# Patient Record
Sex: Female | Born: 1965 | Race: White | Hispanic: No | Smoking: Current every day smoker
Health system: Southern US, Community
[De-identification: ages and names within clinical notes are randomized; demographics above are authoritative.]

## PROBLEM LIST (undated history)

## (undated) DIAGNOSIS — I10 Essential (primary) hypertension: Secondary | ICD-10-CM

## (undated) DIAGNOSIS — F419 Anxiety disorder, unspecified: Secondary | ICD-10-CM

## (undated) HISTORY — PX: OTHER SURGICAL HISTORY: SHX169

---

## 2007-04-28 ENCOUNTER — Other Ambulatory Visit: Admission: RE | Admit: 2007-04-28 | Discharge: 2007-04-28 | Payer: Self-pay | Admitting: Family Medicine

## 2009-08-25 ENCOUNTER — Other Ambulatory Visit: Admission: RE | Admit: 2009-08-25 | Discharge: 2009-08-25 | Payer: Self-pay | Admitting: Family Medicine

## 2011-08-05 ENCOUNTER — Emergency Department (HOSPITAL_COMMUNITY)
Admission: EM | Admit: 2011-08-05 | Discharge: 2011-08-05 | Disposition: A | Discharge status: Home / Self Care | Payer: 59 | Attending: Emergency Medicine | Admitting: Emergency Medicine

## 2011-08-05 ENCOUNTER — Other Ambulatory Visit: Payer: Self-pay

## 2011-08-05 ENCOUNTER — Encounter (HOSPITAL_COMMUNITY): Payer: Self-pay | Admitting: *Deleted

## 2011-08-05 DIAGNOSIS — F411 Generalized anxiety disorder: Secondary | ICD-10-CM | POA: Insufficient documentation

## 2011-08-05 DIAGNOSIS — R070 Pain in throat: Secondary | ICD-10-CM | POA: Insufficient documentation

## 2011-08-05 DIAGNOSIS — R000 Tachycardia, unspecified: Secondary | ICD-10-CM | POA: Insufficient documentation

## 2011-08-05 DIAGNOSIS — J3489 Other specified disorders of nose and nasal sinuses: Secondary | ICD-10-CM | POA: Insufficient documentation

## 2011-08-05 DIAGNOSIS — F17200 Nicotine dependence, unspecified, uncomplicated: Secondary | ICD-10-CM | POA: Insufficient documentation

## 2011-08-05 DIAGNOSIS — R079 Chest pain, unspecified: Secondary | ICD-10-CM | POA: Insufficient documentation

## 2011-08-05 DIAGNOSIS — Z79899 Other long term (current) drug therapy: Secondary | ICD-10-CM | POA: Insufficient documentation

## 2011-08-05 DIAGNOSIS — I10 Essential (primary) hypertension: Secondary | ICD-10-CM | POA: Insufficient documentation

## 2011-08-05 HISTORY — DX: Essential (primary) hypertension: I10

## 2011-08-05 HISTORY — DX: Anxiety disorder, unspecified: F41.9

## 2011-08-05 LAB — CARDIAC PANEL(CRET KIN+CKTOT+MB+TROPI): CK, MB: 1.8 ng/mL (ref 0.3–4.0)

## 2011-08-05 LAB — DIFFERENTIAL
Basophils Relative: 0 % (ref 0–1)
Eosinophils Absolute: 0.1 10*3/uL (ref 0.0–0.7)
Lymphs Abs: 1.4 10*3/uL (ref 0.7–4.0)
Monocytes Absolute: 0.5 10*3/uL (ref 0.1–1.0)
Monocytes Relative: 7 % (ref 3–12)
Neutrophils Relative %: 69 % (ref 43–77)

## 2011-08-05 LAB — POCT I-STAT, CHEM 8
Chloride: 101 mEq/L (ref 96–112)
Creatinine, Ser: 0.7 mg/dL (ref 0.50–1.10)
Glucose, Bld: 122 mg/dL — ABNORMAL HIGH (ref 70–99)
HCT: 40 % (ref 36.0–46.0)
Hemoglobin: 13.6 g/dL (ref 12.0–15.0)
Potassium: 3.7 mEq/L (ref 3.5–5.1)
Sodium: 136 mEq/L (ref 135–145)

## 2011-08-05 LAB — CBC
Hemoglobin: 12.9 g/dL (ref 12.0–15.0)
MCH: 36.9 pg — ABNORMAL HIGH (ref 26.0–34.0)
Platelets: 200 10*3/uL (ref 150–400)
RBC: 3.5 MIL/uL — ABNORMAL LOW (ref 3.87–5.11)
WBC: 6.3 10*3/uL (ref 4.0–10.5)

## 2011-08-05 MED ORDER — SODIUM CHLORIDE 0.9 % IV BOLUS (SEPSIS)
500.0000 mL | Freq: Once | INTRAVENOUS | Status: AC
  Administered 2011-08-05: 500 mL via INTRAVENOUS

## 2011-08-05 NOTE — ED Provider Notes (Signed)
History     CSN: [id number]  Arrival date & time 08/05/11  0151   First MD Initiated Contact with Patient 08/05/11 0301      Chief Complaint  Patient presents with  . Palpitations    (Consider location/radiation/quality/duration/timing/severity/associated sxs/prior treatment) HPI Comments: Patient didn't remember if she took them up.  Her metoprolol yesterday with breakfast.  This evening while relaxing prior to bed.  She asked and she became very concerned.  Denies shortness of breath, nausea, diaphoresis, chest pain, although she says it uncomfortable  Patient is a 46 y.o. female presenting with palpitations. The history is provided by the patient.  Palpitations  This is a new problem. The current episode started 1 to 2 hours ago. The problem occurs constantly. The problem has not changed since onset.On average, each episode lasts 3 hours. Associated symptoms include chest pressure. Pertinent negatives include no fever, no numbness, no chest pain, no irregular heartbeat, no near-syncope, no syncope, no nausea, no headaches, no dizziness, no weakness and no shortness of breath. She has tried nothing for the symptoms.    Past Medical History  Diagnosis Date  . Anxiety   . Hypertension     History reviewed. No pertinent past surgical history.  No family history on file.  History  Substance Use Topics  . Smoking status: Current Everyday Smoker -- 0.5 packs/day for 5 years    Types: Cigarettes  . Smokeless tobacco: Not on file  . Alcohol Use: 0.6 oz/week    1 Glasses of wine per week    OB History    Grav Para Term Preterm Abortions TAB SAB Ect Mult Living                  Review of Systems  Constitutional: Negative for fever.  HENT: Positive for congestion and sore throat.   Respiratory: Negative for shortness of breath.   Cardiovascular: Positive for palpitations. Negative for chest pain, syncope and near-syncope.  Gastrointestinal: Negative for nausea.    Genitourinary: Negative for dysuria.  Neurological: Negative for dizziness, weakness, numbness and headaches.    Allergies  Benadryl and Losartan  Home Medications   Current Outpatient Rx  Name Route Sig Dispense Refill  . ALPRAZOLAM 0.5 MG PO TABS Oral Take 0.5 mg by mouth at bedtime as needed.    Marland Kitchen METOPROLOL SUCCINATE ER 50 MG PO TB24 Oral Take 50 mg by mouth daily. Take with or immediately following a meal.      BP 121/76  Pulse 101  Temp(Src) 98.7 F (37.1 C) (Oral)  Resp 19  Ht 5\' 3"  (1.6 m)  Wt 114 lb (51.71 kg)  BMI 20.19 kg/m2  SpO2 99%  LMP 08/05/2011  Physical Exam  Constitutional: She is oriented to person, place, and time. She appears well-developed.  HENT:  Head: Normocephalic.  Eyes: Pupils are equal, round, and reactive to light.  Neck: Normal range of motion.  Cardiovascular: Regular rhythm, S1 normal and S2 normal.   No extrasystoles are present. Tachycardia present.  PMI is not displaced.   No murmur heard. Pulmonary/Chest: Breath sounds normal. No respiratory distress.  Abdominal: Soft. She exhibits no distension.  Musculoskeletal: She exhibits no edema and no tenderness.  Neurological: She is alert and oriented to person, place, and time.  Skin: Skin is warm and dry.    ED Course  Procedures (including critical care time)  Labs Reviewed  CBC - Abnormal; Notable for the following:    RBC 3.50 (*)  MCV 105.7 (*)    MCH 36.9 (*)    All other components within normal limits  POCT I-STAT, CHEM 8 - Abnormal; Notable for the following:    BUN 5 (*)    Glucose, Bld 122 (*)    All other components within normal limits  DIFFERENTIAL  CARDIAC PANEL(CRET KIN+CKTOT+MB+TROPI)   No results found.   1. Tachycardia     ED ECG REPORT   Date: 08/05/2011  EKG Time: 5:20 AM  Rate: 117  Rhythm: sinus tachycardia,  there are no previous tracings available for comparison  Axis: normal  Intervals:second-degree A-V block, ( Mobitz II )  ST&T  Change: borderline T wave changes  Narrative Interpretation: abnormal      Discussed lab results EKG results with patient did administer a.m. dose of metoprolol.  We'll observe for an additional 30 minutes and then discharged home with followup with her primary care physician.  She is in agreement with this plan   0518 in the morning.  Rate is 92, will discharge him  MDM  Will assess cardiac markers, electrolytes, CBC monitor patient, administer IV fluids.  Initial EKG showed sinus tachycardia with heart rate is within the normal rate.  We'll repeat EKG        Arman Filter, NP 08/05/11 0319  Arman Filter, NP 08/05/11 0452  Arman Filter, NP 08/05/11 [phone]   Medical screening examination/treatment/procedure(s) were performed by non-physician practitioner and as supervising physician I was immediately available for consultation/collaboration.  Sunnie Nielsen, MD 08/05/11 [phone]

## 2011-08-05 NOTE — ED Notes (Signed)
Pt was feeling anxious and began to have palpitations and shortness of breath.  Pt took xanax in an unsuccessful attempt to quell her anxiety.  Pt denies sweating, N/V, dizziness, or weakness.

## 2011-08-05 NOTE — ED Notes (Signed)
[id number]> Expected date:<BR> Expected time:<BR> Means of arrival:<BR> Comments:<BR> Terminal clean...MITES!

## 2011-08-05 NOTE — ED Notes (Signed)
Pt states while reading about 2200 last night, pt states felt as if heart was "pounding" pt states she took one of her Alprazolam, felt worse took another at 2300. Pt states she became SOB, diaphoretic. Pt denies pain. Pt unsure if she took her Metoprolol today. Pt A & O, states she feels as if symptoms getting better. IV est, labs drawn. EKG completed in triage, family at bedside.

## 2011-08-05 NOTE — Discharge Instructions (Signed)
Tachycardia, Nonspecific In adults, the heart normally beats between 60 and 100 times a minute. A heart rate over 100 is called tachycardia. When your heart beats too fast, it may not be able to pump enough blood to the rest of the body. CAUSES   Exercise or exertion.   Fever.   Pain or injury.   Infection.   Loss of fluid (dehydration).   Overactive thyroid.   Lack of red blood cells (anemia).   Anxiety.   Alcohol.   Heart arrhythmia.   Caffeine.   Tobacco products.   Diet pills.   Street drugs.   Heart disease.  SYMPTOMS  Palpitations (rapid or irregular heartbeat).   Dizziness.   Tiredness (fatigue).   Shortness of breath.  DIAGNOSIS  After an exam and taking a history, your caregiver may order:  Blood tests.   Electrocardiogram (EKG).   Heart monitor.  TREATMENT  Treatment will depend on the cause and potential for harm. It may include:  Intravenous (IV) replacement of fluids or blood.   Antidote or reversal medicines.   Changes in your present medicines.   Lifestyle changes.  HOME CARE INSTRUCTIONS   Get rest.   Drink enough water and fluids to keep your urine clear or pale yellow.   Avoid:   Caffeine.   Nicotine.   Alcohol.   Stress.   Chocolate.   Stimulants.   Only take medicine as directed by your caregiver.  SEEK IMMEDIATE MEDICAL CARE IF:   You have pain in your chest, upper arms, jaw, or neck.   You become weak, dizzy, or feel faint.   You have palpitations that will not go away.   You throw up (vomit), have diarrhea, or pass blood.   You look pale and your skin is cool and wet.  MAKE SURE YOU:   Understand these instructions.   Will watch your condition.   Will get help right away if you are not doing well or get worse.  Document Released: 07/11/2004 Document Revised: 02/13/2011 Document Reviewed: 06/03/2005 [facility] Patient Information 2012 [place], [place]. As discussed.  Her cardiac markers were  normal.  Her electrolytes are normal.  Your EKG, other than being at a rate of 117 is normal.  Please make sure to take your medication on a daily basis

## 2011-08-22 ENCOUNTER — Other Ambulatory Visit: Payer: Self-pay | Admitting: Family Medicine

## 2011-08-22 ENCOUNTER — Ambulatory Visit
Admission: RE | Admit: 2011-08-22 | Discharge: 2011-08-22 | Disposition: A | Discharge status: Home / Self Care | Payer: 59 | Source: Ambulatory Visit | Attending: Family Medicine | Admitting: Family Medicine

## 2011-08-22 DIAGNOSIS — M545 Low back pain, unspecified: Secondary | ICD-10-CM

## 2011-10-11 ENCOUNTER — Other Ambulatory Visit (HOSPITAL_COMMUNITY)
Admission: RE | Admit: 2011-10-11 | Discharge: 2011-10-11 | Disposition: A | Discharge status: Home / Self Care | Payer: 59 | Source: Ambulatory Visit | Attending: Family Medicine | Admitting: Family Medicine

## 2011-10-11 ENCOUNTER — Other Ambulatory Visit: Payer: Self-pay | Admitting: Family Medicine

## 2011-10-11 DIAGNOSIS — Z124 Encounter for screening for malignant neoplasm of cervix: Secondary | ICD-10-CM | POA: Insufficient documentation

## 2012-07-03 ENCOUNTER — Other Ambulatory Visit (HOSPITAL_COMMUNITY): Payer: Self-pay | Admitting: Family Medicine

## 2012-07-03 DIAGNOSIS — Z1231 Encounter for screening mammogram for malignant neoplasm of breast: Secondary | ICD-10-CM

## 2012-07-14 ENCOUNTER — Ambulatory Visit (HOSPITAL_COMMUNITY)
Admission: RE | Admit: 2012-07-14 | Discharge: 2012-07-14 | Disposition: A | Discharge status: Home / Self Care | Payer: 59 | Source: Ambulatory Visit | Attending: Family Medicine | Admitting: Family Medicine

## 2012-07-14 ENCOUNTER — Other Ambulatory Visit: Payer: Self-pay | Admitting: Family Medicine

## 2012-07-14 DIAGNOSIS — Z1231 Encounter for screening mammogram for malignant neoplasm of breast: Secondary | ICD-10-CM

## 2012-07-14 DIAGNOSIS — N63 Unspecified lump in unspecified breast: Secondary | ICD-10-CM

## 2012-07-20 ENCOUNTER — Ambulatory Visit
Admission: RE | Admit: 2012-07-20 | Discharge: 2012-07-20 | Disposition: A | Discharge status: Home / Self Care | Payer: 59 | Source: Ambulatory Visit | Attending: Family Medicine | Admitting: Family Medicine

## 2012-07-20 DIAGNOSIS — N63 Unspecified lump in unspecified breast: Secondary | ICD-10-CM

## 2015-09-19 ENCOUNTER — Other Ambulatory Visit: Payer: Self-pay | Admitting: Family Medicine

## 2015-09-19 ENCOUNTER — Other Ambulatory Visit (HOSPITAL_COMMUNITY)
Admission: RE | Admit: 2015-09-19 | Discharge: 2015-09-19 | Disposition: A | Discharge status: Home / Self Care | Payer: BLUE CROSS/BLUE SHIELD | Source: Ambulatory Visit | Attending: Family Medicine | Admitting: Family Medicine

## 2015-09-19 DIAGNOSIS — Z1211 Encounter for screening for malignant neoplasm of colon: Secondary | ICD-10-CM | POA: Diagnosis not present

## 2015-09-19 DIAGNOSIS — Z1151 Encounter for screening for human papillomavirus (HPV): Secondary | ICD-10-CM | POA: Diagnosis not present

## 2015-09-19 DIAGNOSIS — E785 Hyperlipidemia, unspecified: Secondary | ICD-10-CM | POA: Diagnosis not present

## 2015-09-19 DIAGNOSIS — Z01419 Encounter for gynecological examination (general) (routine) without abnormal findings: Secondary | ICD-10-CM | POA: Diagnosis not present

## 2015-09-19 DIAGNOSIS — Z0000 Encounter for general adult medical examination without abnormal findings: Secondary | ICD-10-CM | POA: Diagnosis not present

## 2015-09-19 DIAGNOSIS — Z124 Encounter for screening for malignant neoplasm of cervix: Secondary | ICD-10-CM | POA: Diagnosis not present

## 2015-09-22 LAB — CYTOLOGY - PAP

## 2016-03-22 DIAGNOSIS — F410 Panic disorder [episodic paroxysmal anxiety] without agoraphobia: Secondary | ICD-10-CM | POA: Diagnosis not present

## 2016-04-02 DIAGNOSIS — H04123 Dry eye syndrome of bilateral lacrimal glands: Secondary | ICD-10-CM | POA: Diagnosis not present

## 2016-04-02 DIAGNOSIS — H01001 Unspecified blepharitis right upper eyelid: Secondary | ICD-10-CM | POA: Diagnosis not present

## 2016-04-02 DIAGNOSIS — H01002 Unspecified blepharitis right lower eyelid: Secondary | ICD-10-CM | POA: Diagnosis not present

## 2016-04-02 DIAGNOSIS — H524 Presbyopia: Secondary | ICD-10-CM | POA: Diagnosis not present

## 2016-09-23 DIAGNOSIS — E785 Hyperlipidemia, unspecified: Secondary | ICD-10-CM | POA: Diagnosis not present

## 2016-09-23 DIAGNOSIS — I10 Essential (primary) hypertension: Secondary | ICD-10-CM | POA: Diagnosis not present

## 2016-09-23 DIAGNOSIS — Z0000 Encounter for general adult medical examination without abnormal findings: Secondary | ICD-10-CM | POA: Diagnosis not present

## 2016-09-23 DIAGNOSIS — Z1211 Encounter for screening for malignant neoplasm of colon: Secondary | ICD-10-CM | POA: Diagnosis not present

## 2017-07-25 DIAGNOSIS — M7741 Metatarsalgia, right foot: Secondary | ICD-10-CM | POA: Diagnosis not present

## 2017-07-25 DIAGNOSIS — M79671 Pain in right foot: Secondary | ICD-10-CM | POA: Diagnosis not present

## 2017-08-21 DIAGNOSIS — M7741 Metatarsalgia, right foot: Secondary | ICD-10-CM | POA: Diagnosis not present

## 2017-08-21 DIAGNOSIS — M79671 Pain in right foot: Secondary | ICD-10-CM | POA: Diagnosis not present

## 2017-09-30 DIAGNOSIS — E785 Hyperlipidemia, unspecified: Secondary | ICD-10-CM | POA: Diagnosis not present

## 2017-09-30 DIAGNOSIS — I10 Essential (primary) hypertension: Secondary | ICD-10-CM | POA: Diagnosis not present

## 2017-09-30 DIAGNOSIS — Z0000 Encounter for general adult medical examination without abnormal findings: Secondary | ICD-10-CM | POA: Diagnosis not present

## 2017-11-24 DIAGNOSIS — F419 Anxiety disorder, unspecified: Secondary | ICD-10-CM | POA: Diagnosis not present

## 2017-11-24 DIAGNOSIS — F17210 Nicotine dependence, cigarettes, uncomplicated: Secondary | ICD-10-CM | POA: Diagnosis not present

## 2017-11-24 DIAGNOSIS — R251 Tremor, unspecified: Secondary | ICD-10-CM | POA: Diagnosis not present

## 2017-12-26 DIAGNOSIS — R05 Cough: Secondary | ICD-10-CM | POA: Diagnosis not present

## 2018-09-24 DIAGNOSIS — M6289 Other specified disorders of muscle: Secondary | ICD-10-CM | POA: Diagnosis not present

## 2018-09-28 DIAGNOSIS — H1013 Acute atopic conjunctivitis, bilateral: Secondary | ICD-10-CM | POA: Diagnosis not present

## 2018-09-28 DIAGNOSIS — M25539 Pain in unspecified wrist: Secondary | ICD-10-CM | POA: Diagnosis not present

## 2018-09-28 DIAGNOSIS — I10 Essential (primary) hypertension: Secondary | ICD-10-CM | POA: Diagnosis not present

## 2019-10-21 DIAGNOSIS — E785 Hyperlipidemia, unspecified: Secondary | ICD-10-CM | POA: Diagnosis not present

## 2019-10-21 DIAGNOSIS — F410 Panic disorder [episodic paroxysmal anxiety] without agoraphobia: Secondary | ICD-10-CM | POA: Diagnosis not present

## 2019-10-21 DIAGNOSIS — J309 Allergic rhinitis, unspecified: Secondary | ICD-10-CM | POA: Diagnosis not present

## 2019-10-21 DIAGNOSIS — I10 Essential (primary) hypertension: Secondary | ICD-10-CM | POA: Diagnosis not present

## 2019-11-27 DIAGNOSIS — Z20822 Contact with and (suspected) exposure to covid-19: Secondary | ICD-10-CM | POA: Diagnosis not present

## 2019-11-27 DIAGNOSIS — Z03818 Encounter for observation for suspected exposure to other biological agents ruled out: Secondary | ICD-10-CM | POA: Diagnosis not present

## 2019-11-30 ENCOUNTER — Other Ambulatory Visit: Payer: Self-pay

## 2019-11-30 ENCOUNTER — Encounter (HOSPITAL_COMMUNITY): Payer: Self-pay

## 2019-11-30 ENCOUNTER — Ambulatory Visit (HOSPITAL_COMMUNITY)
Admission: EM | Admit: 2019-11-30 | Discharge: 2019-11-30 | Disposition: A | Discharge status: Home / Self Care | Payer: BC Managed Care – PPO | Attending: Family Medicine | Admitting: Family Medicine

## 2019-11-30 DIAGNOSIS — R300 Dysuria: Secondary | ICD-10-CM | POA: Insufficient documentation

## 2019-11-30 DIAGNOSIS — R350 Frequency of micturition: Secondary | ICD-10-CM | POA: Diagnosis not present

## 2019-11-30 DIAGNOSIS — N3090 Cystitis, unspecified without hematuria: Secondary | ICD-10-CM | POA: Insufficient documentation

## 2019-11-30 DIAGNOSIS — L509 Urticaria, unspecified: Secondary | ICD-10-CM | POA: Diagnosis not present

## 2019-11-30 LAB — POCT URINALYSIS DIP (DEVICE)
Glucose, UA: 250 mg/dL — AB
Hgb urine dipstick: NEGATIVE
Ketones, ur: 15 mg/dL — AB
Nitrite: POSITIVE — AB
Protein, ur: >=300 mg/dL — AB
Specific Gravity, Urine: <=1.005 (ref 1.005–1.030)
Urobilinogen, UA: >=8 mg/dL (ref 0.0–1.0)
pH: 5 (ref 5.0–8.0)

## 2019-11-30 MED ORDER — CEPHALEXIN 500 MG PO CAPS: 500.0000 mg | ORAL_CAPSULE | Freq: Two times a day (BID) | ORAL | 0 refills | Status: DC

## 2019-11-30 MED ORDER — PREDNISONE 10 MG (21) PO TBPK: ORAL_TABLET | Freq: Every day | ORAL | 0 refills | Status: DC

## 2019-11-30 NOTE — ED Triage Notes (Signed)
Pt presents today for hives that began last night. Pt states she was given a prescription of "ciprofloxacin" for as needed "urine infections" and has been taking them for 3 days until rash and itching began. Pt has visible red rash on chest and face. Pt states rash itches and is causing loss of sleep. Pt has been treating hives with benadryl, last dose approx 0900. Pt denies difficulty breathing.

## 2019-11-30 NOTE — ED Provider Notes (Signed)
Emma Warren   [id number] 11/30/19 Arrival Time: 5361  ASSESSMENT & PLAN:  1. Urticaria   2. Urinary frequency   3. Dysuria   4. Cystitis     Begin: Meds ordered this encounter  Medications  . predniSONE (STERAPRED UNI-PAK 21 TAB) 10 MG (21) TBPK tablet    Sig: Take by mouth daily. Take as directed.    Dispense:  21 tablet    Refill:  0  . cephALEXin (KEFLEX) 500 MG capsule    Sig: Take 1 capsule (500 mg total) by mouth 2 (two) times daily.    Dispense:  10 capsule    Refill:  0    No anaphylaxis. Benadryl if needed. No s/s of pyelonephritis. Urine culture sent. Will notify of any significant results. Ensure adequate fluid intake.   Reviewed expectations re: course of current medical issues. Questions answered. Outlined signs and symptoms indicating need for more acute intervention. Understanding verbalized. After Visit Summary given.   SUBJECTIVE: History from: patient. Emma Warren is a 54 y.o. female who reports possible rxn to cipro taken for 3 days; given by her pcp to tx UTI. Finished. Noted facial swelling and skin rash yest evening. No swallowing or breathing difficulties. H/O hives. Still with urinary frequency and dysuria. Afebrile. No vaginal d/c. Benadryl without relief.   OBJECTIVE:  Vitals:   11/30/19 1007  BP: 128/78  Pulse: 75  Resp: 16  Temp: 98.5 F (36.9 C)  SpO2: 100%    General appearance: alert; no distress Eyes: PERRLA; EOMI; conjunctiva normal HENT: [place]; AT; nasal mucosa normal; oral mucosa normal Neck: supple  Lungs: speaks full sentences without difficulty; unlabored Extremities: no edema Skin: warm and dry; smooth, slightly elevated and erythematous plaques of variable size over her face and extremities mainly Neurologic: normal gait Psychological: alert and cooperative; normal mood and affect  Labs:  Labs Reviewed  POCT URINALYSIS DIP (DEVICE) - Abnormal; Notable for the following components:      Result Value     Glucose, UA 250 (*)    Bilirubin Urine MODERATE (*)    Ketones, ur 15 (*)    Protein, ur >=300 (*)    Nitrite POSITIVE (*)    Leukocytes,Ua LARGE (*)    All other components within normal limits  URINE CULTURE      Allergies  Allergen Reactions  . Losartan     Makes her hot    Past Medical History:  Diagnosis Date  . Anxiety   . Hypertension    Social History   Socioeconomic History  . Marital status: Widowed    Spouse name: Not on file  . Number of children: Not on file  . Years of education: Not on file  . Highest education level: Not on file  Occupational History  . Not on file  Tobacco Use  . Smoking status: Current Every Day Smoker    Packs/day: 0.50    Years: 5.00    Pack years: 2.50    Types: Cigarettes  . Smokeless tobacco: Never Used  Substance and Sexual Activity  . Alcohol use: Yes    Alcohol/week: 1.0 standard drink    Types: 1 Glasses of wine per week  . Drug use: Not on file  . Sexual activity: Not on file  Other Topics Concern  . Not on file  Social History Narrative  . Not on file   Social Determinants of Health   Financial Resource Strain:   . Difficulty of Paying Living Expenses:  Food Insecurity:   . Worried About Programme researcher, broadcasting/film/video in the Last Year:   . Barista in the Last Year:   Transportation Needs:   . Freight forwarder (Medical):   Marland Kitchen Lack of Transportation (Non-Medical):   Physical Activity:   . Days of Exercise per Week:   . Minutes of Exercise per Session:   Stress:   . Feeling of Stress :   Social Connections:   . Frequency of Communication with Friends and Family:   . Frequency of Social Gatherings with Friends and Family:   . Attends Religious Services:   . Active Member of Clubs or Organizations:   . Attends Banker Meetings:   Marland Kitchen Marital Status:   Intimate Partner Violence:   . Fear of Current or Ex-Partner:   . Emotionally Abused:   Marland Kitchen Physically Abused:   . Sexually Abused:     History reviewed. No pertinent family history. History reviewed. No pertinent surgical history.   Mardella Layman, MD 11/30/19 1156

## 2019-12-01 LAB — URINE CULTURE: Culture: NO GROWTH

## 2019-12-07 DIAGNOSIS — R1084 Generalized abdominal pain: Secondary | ICD-10-CM | POA: Diagnosis not present

## 2019-12-07 DIAGNOSIS — R5382 Chronic fatigue, unspecified: Secondary | ICD-10-CM | POA: Diagnosis not present

## 2019-12-07 DIAGNOSIS — E785 Hyperlipidemia, unspecified: Secondary | ICD-10-CM | POA: Diagnosis not present

## 2019-12-07 DIAGNOSIS — N951 Menopausal and female climacteric states: Secondary | ICD-10-CM | POA: Diagnosis not present

## 2019-12-07 DIAGNOSIS — Z0000 Encounter for general adult medical examination without abnormal findings: Secondary | ICD-10-CM | POA: Diagnosis not present

## 2019-12-07 DIAGNOSIS — I10 Essential (primary) hypertension: Secondary | ICD-10-CM | POA: Diagnosis not present

## 2019-12-14 ENCOUNTER — Other Ambulatory Visit: Payer: Self-pay | Admitting: Family Medicine

## 2019-12-14 DIAGNOSIS — R7989 Other specified abnormal findings of blood chemistry: Secondary | ICD-10-CM

## 2019-12-14 DIAGNOSIS — Z1231 Encounter for screening mammogram for malignant neoplasm of breast: Secondary | ICD-10-CM

## 2019-12-15 ENCOUNTER — Ambulatory Visit
Admission: RE | Admit: 2019-12-15 | Discharge: 2019-12-15 | Disposition: A | Discharge status: Home / Self Care | Payer: BC Managed Care – PPO | Source: Ambulatory Visit | Attending: Family Medicine | Admitting: Family Medicine

## 2019-12-15 ENCOUNTER — Other Ambulatory Visit: Payer: Self-pay | Admitting: Family Medicine

## 2019-12-15 DIAGNOSIS — R1084 Generalized abdominal pain: Secondary | ICD-10-CM

## 2019-12-15 DIAGNOSIS — K7689 Other specified diseases of liver: Secondary | ICD-10-CM | POA: Diagnosis not present

## 2019-12-15 DIAGNOSIS — R7989 Other specified abnormal findings of blood chemistry: Secondary | ICD-10-CM

## 2019-12-15 DIAGNOSIS — R748 Abnormal levels of other serum enzymes: Secondary | ICD-10-CM

## 2019-12-27 ENCOUNTER — Ambulatory Visit
Admission: RE | Admit: 2019-12-27 | Discharge: 2019-12-27 | Disposition: A | Discharge status: Home / Self Care | Payer: BC Managed Care – PPO | Source: Ambulatory Visit | Attending: Family Medicine | Admitting: Family Medicine

## 2019-12-27 DIAGNOSIS — K7689 Other specified diseases of liver: Secondary | ICD-10-CM | POA: Diagnosis not present

## 2019-12-27 DIAGNOSIS — R748 Abnormal levels of other serum enzymes: Secondary | ICD-10-CM

## 2019-12-27 DIAGNOSIS — R1084 Generalized abdominal pain: Secondary | ICD-10-CM

## 2019-12-27 DIAGNOSIS — M4319 Spondylolisthesis, multiple sites in spine: Secondary | ICD-10-CM | POA: Diagnosis not present

## 2019-12-27 DIAGNOSIS — M5137 Other intervertebral disc degeneration, lumbosacral region: Secondary | ICD-10-CM | POA: Diagnosis not present

## 2019-12-27 DIAGNOSIS — R7989 Other specified abnormal findings of blood chemistry: Secondary | ICD-10-CM | POA: Diagnosis not present

## 2019-12-27 MED ORDER — IOPAMIDOL (ISOVUE-300) INJECTION 61%
100.0000 mL | Freq: Once | INTRAVENOUS | Status: AC | PRN
  Administered 2019-12-27: 100 mL via INTRAVENOUS

## 2019-12-30 DIAGNOSIS — L301 Dyshidrosis [pompholyx]: Secondary | ICD-10-CM | POA: Diagnosis not present

## 2019-12-30 DIAGNOSIS — R748 Abnormal levels of other serum enzymes: Secondary | ICD-10-CM | POA: Diagnosis not present

## 2020-02-04 ENCOUNTER — Ambulatory Visit
Admission: RE | Admit: 2020-02-04 | Discharge: 2020-02-04 | Disposition: A | Discharge status: Home / Self Care | Payer: BC Managed Care – PPO | Source: Ambulatory Visit | Attending: Family Medicine | Admitting: Family Medicine

## 2020-02-04 ENCOUNTER — Other Ambulatory Visit: Payer: Self-pay

## 2020-02-04 DIAGNOSIS — Z1231 Encounter for screening mammogram for malignant neoplasm of breast: Secondary | ICD-10-CM | POA: Diagnosis not present

## 2020-02-08 DIAGNOSIS — M25473 Effusion, unspecified ankle: Secondary | ICD-10-CM | POA: Diagnosis not present

## 2020-02-08 DIAGNOSIS — M5412 Radiculopathy, cervical region: Secondary | ICD-10-CM | POA: Diagnosis not present

## 2020-02-16 ENCOUNTER — Other Ambulatory Visit (HOSPITAL_COMMUNITY)
Admission: RE | Admit: 2020-02-16 | Discharge: 2020-02-16 | Disposition: A | Discharge status: Home / Self Care | Payer: BC Managed Care – PPO | Source: Ambulatory Visit | Attending: Family Medicine | Admitting: Family Medicine

## 2020-02-16 ENCOUNTER — Other Ambulatory Visit: Payer: Self-pay | Admitting: Family Medicine

## 2020-02-16 DIAGNOSIS — Z01411 Encounter for gynecological examination (general) (routine) with abnormal findings: Secondary | ICD-10-CM | POA: Insufficient documentation

## 2020-02-16 DIAGNOSIS — Z124 Encounter for screening for malignant neoplasm of cervix: Secondary | ICD-10-CM | POA: Diagnosis not present

## 2020-02-16 DIAGNOSIS — Z0000 Encounter for general adult medical examination without abnormal findings: Secondary | ICD-10-CM | POA: Diagnosis not present

## 2020-02-18 LAB — CYTOLOGY - PAP
Comment: NEGATIVE
Diagnosis: NEGATIVE
High risk HPV: NEGATIVE

## 2020-04-24 ENCOUNTER — Encounter: Payer: Self-pay | Admitting: Neurology

## 2020-04-24 ENCOUNTER — Ambulatory Visit (INDEPENDENT_AMBULATORY_CARE_PROVIDER_SITE_OTHER): Payer: BC Managed Care – PPO | Admitting: Neurology

## 2020-04-24 VITALS — BP 127/85 | HR 96 | Ht 63.0 in | Wt 101.0 lb

## 2020-04-24 DIAGNOSIS — M79602 Pain in left arm: Secondary | ICD-10-CM | POA: Diagnosis not present

## 2020-04-24 DIAGNOSIS — R5383 Other fatigue: Secondary | ICD-10-CM

## 2020-04-24 DIAGNOSIS — M5412 Radiculopathy, cervical region: Secondary | ICD-10-CM | POA: Diagnosis not present

## 2020-04-24 DIAGNOSIS — R200 Anesthesia of skin: Secondary | ICD-10-CM | POA: Diagnosis not present

## 2020-04-24 MED ORDER — GABAPENTIN 300 MG PO CAPS: 300.0000 mg | ORAL_CAPSULE | Freq: Three times a day (TID) | ORAL | 11 refills | Status: DC

## 2020-04-24 NOTE — Progress Notes (Signed)
[facility]  PATIENT: Emma Warren DOB: Sep 08, 1965  REFERRING DOCTOR OR PCP: Shirlean Mylar, MD SOURCE: Patient, notes from Dr. Hyman Hopes, imaging reports, CT scans reviewed.  _________________________________   HISTORICAL  CHIEF COMPLAINT:  Chief Complaint  Patient presents with  . New Patient (Initial Visit)    RM 13, alone. Paper referral from Shirlean Mylar, MD (PCP) for cervical radiculopathy. Pt reports mostly left hand numb. Sometimes radiates up to shoulder. See Chiropracter for the past 2 weeks. Has happened in her right hand as well. Typically occurs while she is walking in the park.     HISTORY OF PRESENT ILLNESS:  I saw your patient, Emma Warren, at [facility] for neurologic consultation regarding her left greater than right arm pain and numbness.  She is a 54 yo woman who has had left > right arm /hand numbness over the last 2-3 months.  No activity was definitely associated with the onset of symptoms.    However a week before symptoms started she recalls moving some furniture.   When most intense, pain and numbness goes from the shoulder to her fingers, usually the left but sometimes both hands..   Sometimes all fingers and other times just the thumb.   She notes her left grip seems weaker the past couple months as well.   Ibuprofen has not helped much.   Walking longer distance increase the symptoms at times.    She has no cervical spine imaging.   In the past she had sciatica. 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  She also reports that she is having issues with feeling more tired and wonders if it could be due to her neurologic problem..   She sometimes feels off balanced when more tired.   She does not always sleep well.   She snores mildly but does not gasp or have witnessed  pauses.       REVIEW OF SYSTEMS: Constitutional: No fevers, chills, sweats, or change in appetite.  Notes mild ankle edema. She notes fatigue Eyes: No visual changes, double vision, eye pain Ear, nose and throat: No hearing loss, ear pain, nasal congestion, sore throat Cardiovascular: No chest pain, palpitations Respiratory: No shortness of breath at rest or with exertion.   No wheezes GastrointestinaI: No nausea, vomiting, diarrhea, abdominal pain, fecal incontinence Genitourinary: No dysuria, urinary retention or frequency.  No nocturia. Musculoskeletal: as above. Integumentary: No rash, pruritus, skin lesions Neurological: as above Psychiatric: No depression at this time.  No anxiety Endocrine: No palpitations, diaphoresis, change in appetite, change in weigh or increased thirst Hematologic/Lymphatic: No anemia, purpura, petechiae. Allergic/Immunologic: No itchy/runny eyes, nasal congestion, recent allergic reactions, rashes  ALLERGIES: Allergies  Allergen Reactions  . Losartan     Makes her hot    HOME MEDICATIONS:  Current Outpatient Medications:  .  ALPRAZolam (XANAX) 0.5 MG tablet, Take 0.5 mg by mouth at bedtime as needed., Disp: , Rfl:  .  ibuprofen (ADVIL) 200 MG tablet, Take 200 mg by mouth every 6 (six) hours as needed., Disp: , Rfl:  .  metoprolol succinate (TOPROL-XL) 50 MG 24 hr tablet, Take 50 mg by mouth daily. Take with or immediately following a meal., Disp: , Rfl:   PAST MEDICAL HISTORY: Past Medical History:  Diagnosis Date  . Anxiety   . Hypertension  PAST SURGICAL HISTORY: Past Surgical History:  Procedure Laterality Date  . left hand surgery     tendon repair    FAMILY HISTORY: Family History  Problem Relation Age of Onset  . Stomach cancer Maternal Grandmother   . Stomach cancer Maternal Grandfather     SOCIAL HISTORY:  Social History   Socioeconomic History  . Marital status: Widowed    Spouse name: Not on file  . Number of  children: 1  . Years of education: phD  . Highest education level: Not on file  Occupational History  . Not on file  Tobacco Use  . Smoking status: Current Every Day Smoker    Packs/day: 0.50    Years: 5.00    Pack years: 2.50    Types: Cigarettes  . Smokeless tobacco: Never Used  . Tobacco comment: 8-15 cigs per day  Substance and Sexual Activity  . Alcohol use: Not Currently    Alcohol/week: 1.0 standard drink    Types: 1 Glasses of wine per week  . Drug use: Never  . Sexual activity: Not on file  Other Topics Concern  . Not on file  Social History Narrative   Lives alone   Caffeine use: coffee daily (about 2 cups)   Right handed    Social Determinants of Health   Financial Resource Strain:   . Difficulty of Paying Living Expenses: Not on file  Food Insecurity:   . Worried About Programme researcher, broadcasting/film/video in the Last Year: Not on file  . Ran Out of Food in the Last Year: Not on file  Transportation Needs:   . Lack of Transportation (Medical): Not on file  . Lack of Transportation (Non-Medical): Not on file  Physical Activity:   . Days of Exercise per Week: Not on file  . Minutes of Exercise per Session: Not on file  Stress:   . Feeling of Stress : Not on file  Social Connections:   . Frequency of Communication with Friends and Family: Not on file  . Frequency of Social Gatherings with Friends and Family: Not on file  . Attends Religious Services: Not on file  . Active Member of Clubs or Organizations: Not on file  . Attends Banker Meetings: Not on file  . Marital Status: Not on file  Intimate Partner Violence:   . Fear of Current or Ex-Partner: Not on file  . Emotionally Abused: Not on file  . Physically Abused: Not on file  . Sexually Abused: Not on file     PHYSICAL EXAM  Vitals:   04/24/20 1455  BP: 127/85  Pulse: 96  Weight: 101 lb (45.8 kg)  Height: 5\' 3"  (1.6 m)    Body mass index is 17.89 kg/m.   General: The patient is  well-developed and well-nourished and in no acute distress  HEENT:  Head is [place]/AT.  Sclera are anicteric.  Funduscopic exam shows normal optic discs and retinal vessels.  Neck: No carotid bruits are noted.  The neck is nontender with good range of motion.  Cardiovascular: The heart has a regular rate and rhythm with a normal S1 and S2. There were no murmurs, gallops or rubs.    Skin: Extremities are without rash or edema.  Neurologic Exam  Mental status: The patient is alert and oriented x 3 at the time of the examination. The patient has apparent normal recent and remote memory, with an apparently normal attention span and concentration ability.   Speech is normal.  Cranial nerves: Extraocular movements are full. Facial strength is normal. Trapezius and sternocleidomastoid strength is normal. No obvious hearing deficits are noted.  Motor:  Muscle bulk is normal.   Tone is normal. Strength is 4+/5 in the triceps, finger extensors and pronators on the left and 5 / 5 elsewhere.   Sensory: Sensory testing is intact to pinprick, soft touch and vibration sensation in all 4 extremities.  Coordination: Cerebellar testing reveals good finger-nose-finger and heel-to-shin bilaterally.  Gait and station: Station is normal.   Gait is normal. Tandem gait is normal. Romberg is negative.   Reflexes: Deep tendon reflexes are symmetric and normal bilaterally.       DIAGNOSTIC DATA (LABS, IMAGING, TESTING) - I reviewed patient records, labs, notes, testing and imaging myself where available.  Lab Results  Component Value Date   WBC 6.3 08/05/2011   HGB 13.6 08/05/2011   HCT 40.0 08/05/2011   MCV 105.7 (H) 08/05/2011   PLT 200 08/05/2011      Component Value Date/Time   NA 136 08/05/2011 0316   K 3.7 08/05/2011 0316   CL 101 08/05/2011 0316   GLUCOSE 122 (H) 08/05/2011 0316   BUN 5 (L) 08/05/2011 0316   CREATININE 0.70 08/05/2011 0316       ASSESSMENT AND PLAN  Cervical  radiculopathy - Plan: MR CERVICAL SPINE WO CONTRAST, Ambulatory referral to Physical Therapy  Pain of left upper extremity - Plan: MR CERVICAL SPINE WO CONTRAST, Ambulatory referral to Physical Therapy  Arm numbness - Plan: MR CERVICAL SPINE WO CONTRAST  Other fatigue - Plan: CBC with Differential/Platelet, TSH, Comprehensive metabolic panel   In summary, Ms. Shaw is a 54 year old woman with a several month history of left greater than right arm pain and numbness. She has mild weakness in some of the C7 innervated muscles on the left. Most likely, her symptoms are due to a C7 radiculopathy. To help with the neuro pathic component of pain, I started her on gabapentin 300 mg p.o. 3 times daily. This dose could be increased in the future if needed. Additionally she may benefit from taking some ibuprofen as needed. We will check an MRI of the cervical spine to further evaluate and consider referral for ESI or to a surgeon based on the results. Additionally, a referral to physical therapy was requested. We discussed doing exercises for the neck to help with the pain and to build up muscles.  Additionally, she reported that she has had a lot of fatigue the last couple months and feels off balance when her fatigue is worse. The etiology is uncertain. I will check CBC/CMP/TSH. She will return to see [place] as needed and we will let her know the results of the studies.   Thank you for asking me to see Ms. Bentler. Please let me know if I can be of further assistance with her or other patients in the future.   Coretha Creswell A. Epimenio Foot, MD, [facility] 04/24/2020, 3:22 PM Certified in Neurology, Clinical Neurophysiology, Sleep Medicine and Neuroimaging  [facility] [address] [place], [place] [address] [phone]

## 2020-04-25 ENCOUNTER — Ambulatory Visit: Payer: BC Managed Care – PPO | Admitting: Neurology

## 2020-04-25 ENCOUNTER — Telehealth: Payer: Self-pay | Admitting: Neurology

## 2020-04-25 LAB — CBC WITH DIFFERENTIAL/PLATELET
Basophils Absolute: 0.1 10*3/uL (ref 0.0–0.2)
Basos: 1 %
EOS (ABSOLUTE): 0.3 10*3/uL (ref 0.0–0.4)
Eos: 4 %
Hematocrit: 41 % (ref 34.0–46.6)
Hemoglobin: 13.6 g/dL (ref 11.1–15.9)
Immature Grans (Abs): 0 10*3/uL (ref 0.0–0.1)
Immature Granulocytes: 0 %
Lymphocytes Absolute: 2.6 10*3/uL (ref 0.7–3.1)
Lymphs: 34 %
MCH: 31.9 pg (ref 26.6–33.0)
MCHC: 33.2 g/dL (ref 31.5–35.7)
MCV: 96 fL (ref 79–97)
Monocytes Absolute: 0.5 10*3/uL (ref 0.1–0.9)
Monocytes: 7 %
Neutrophils Absolute: 4.2 10*3/uL (ref 1.4–7.0)
Neutrophils: 54 %
Platelets: 264 10*3/uL (ref 150–450)
RBC: 4.26 x10E6/uL (ref 3.77–5.28)
RDW: 12 % (ref 11.7–15.4)
WBC: 7.8 10*3/uL (ref 3.4–10.8)

## 2020-04-25 LAB — COMPREHENSIVE METABOLIC PANEL
ALT: 21 IU/L (ref 0–32)
AST: 31 IU/L (ref 0–40)
Albumin/Globulin Ratio: 1.6 (ref 1.2–2.2)
Albumin: 4.7 g/dL (ref 3.8–4.9)
Alkaline Phosphatase: 160 IU/L — ABNORMAL HIGH (ref 44–121)
BUN/Creatinine Ratio: 14 (ref 9–23)
BUN: 11 mg/dL (ref 6–24)
Bilirubin Total: <0.2 mg/dL (ref 0.0–1.2)
CO2: 25 mmol/L (ref 20–29)
Calcium: 10.2 mg/dL (ref 8.7–10.2)
Chloride: 105 mmol/L (ref 96–106)
Creatinine, Ser: 0.81 mg/dL (ref 0.57–1.00)
GFR calc Af Amer: 94 mL/min/{1.73_m2} (ref >59)
GFR calc non Af Amer: 81 mL/min/{1.73_m2} (ref >59)
Globulin, Total: 2.9 g/dL (ref 1.5–4.5)
Glucose: 105 mg/dL — ABNORMAL HIGH (ref 65–99)
Potassium: 4.3 mmol/L (ref 3.5–5.2)
Sodium: 143 mmol/L (ref 134–144)
Total Protein: 7.6 g/dL (ref 6.0–8.5)

## 2020-04-25 LAB — TSH: TSH: 0.981 u[IU]/mL (ref 0.450–4.500)

## 2020-04-25 NOTE — Telephone Encounter (Signed)
Patient returned my call due to the cost she is going to hold off and think about it. I did offer her the payment plan.

## 2020-04-25 NOTE — Telephone Encounter (Signed)
LVM for pt to call back about scheduling mri  [employer] auth: [id number] (exp. 04/25/20 to 10/21/20)

## 2020-04-26 ENCOUNTER — Telehealth: Payer: Self-pay | Admitting: *Deleted

## 2020-04-26 NOTE — Telephone Encounter (Signed)
-----   Message from Melvyn Novas, MD sent at 04/26/2020  1:15 PM EST ----- All normal results.

## 2020-04-26 NOTE — Telephone Encounter (Signed)
Called and spoke with pt about lab results. Pt verbalized understanding.  

## 2020-04-26 NOTE — Progress Notes (Signed)
All normal results

## 2020-06-06 ENCOUNTER — Other Ambulatory Visit: Payer: Self-pay | Admitting: Neurology

## 2020-06-06 DIAGNOSIS — M25519 Pain in unspecified shoulder: Secondary | ICD-10-CM | POA: Insufficient documentation

## 2020-06-06 DIAGNOSIS — M79602 Pain in left arm: Secondary | ICD-10-CM | POA: Insufficient documentation

## 2020-07-05 ENCOUNTER — Other Ambulatory Visit: Payer: Self-pay | Admitting: Neurology

## 2020-07-05 DIAGNOSIS — R200 Anesthesia of skin: Secondary | ICD-10-CM | POA: Insufficient documentation

## 2020-07-05 DIAGNOSIS — M79602 Pain in left arm: Secondary | ICD-10-CM

## 2020-09-22 IMAGING — MG DIGITAL SCREENING BILAT W/ TOMO W/ CAD
6 of 10 series · 6 of 30 positions shown · non-contrast
Comparison: Previous exam(s).

CLINICAL DATA: Screening.

EXAM:
DIGITAL SCREENING BILATERAL MAMMOGRAM WITH TOMO AND CAD

[L CC synth-2D]
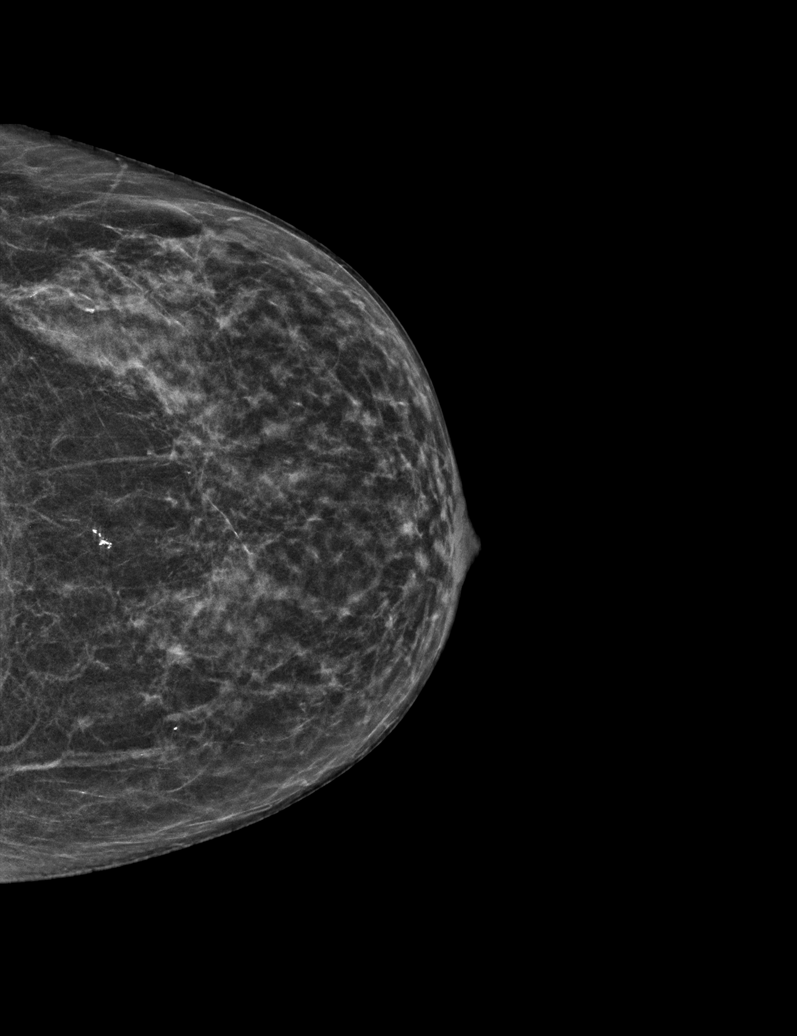

[R MLO synth-2D]
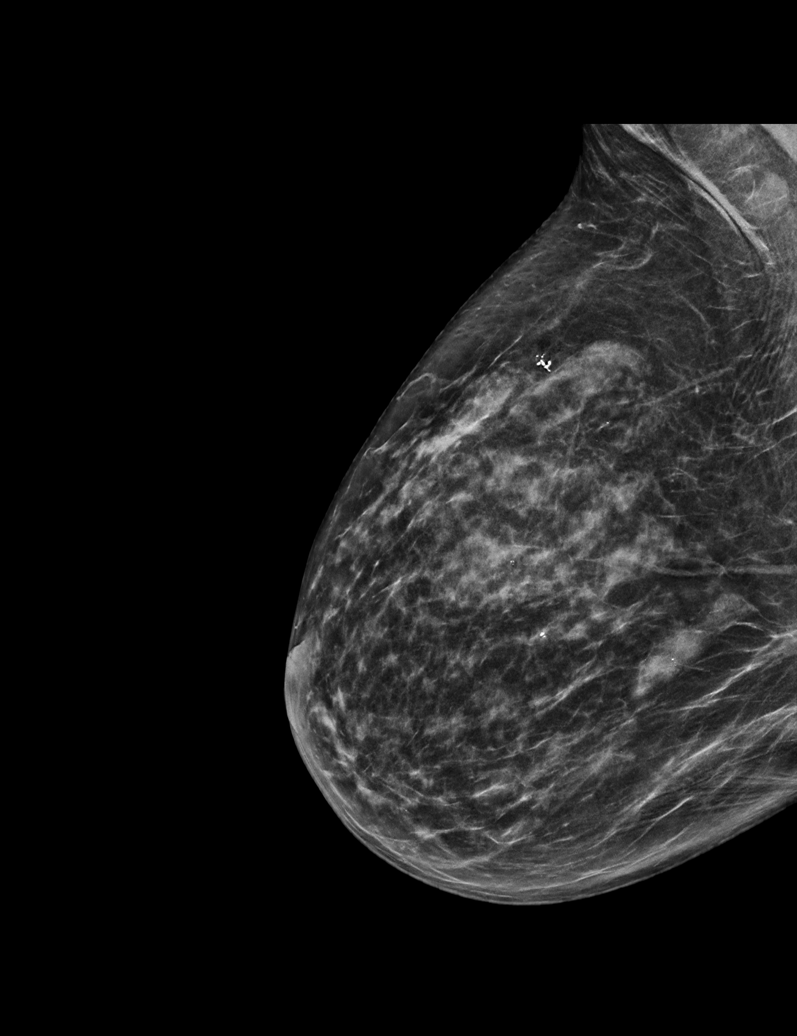

[L MLO synth-2D (1 of 2)]
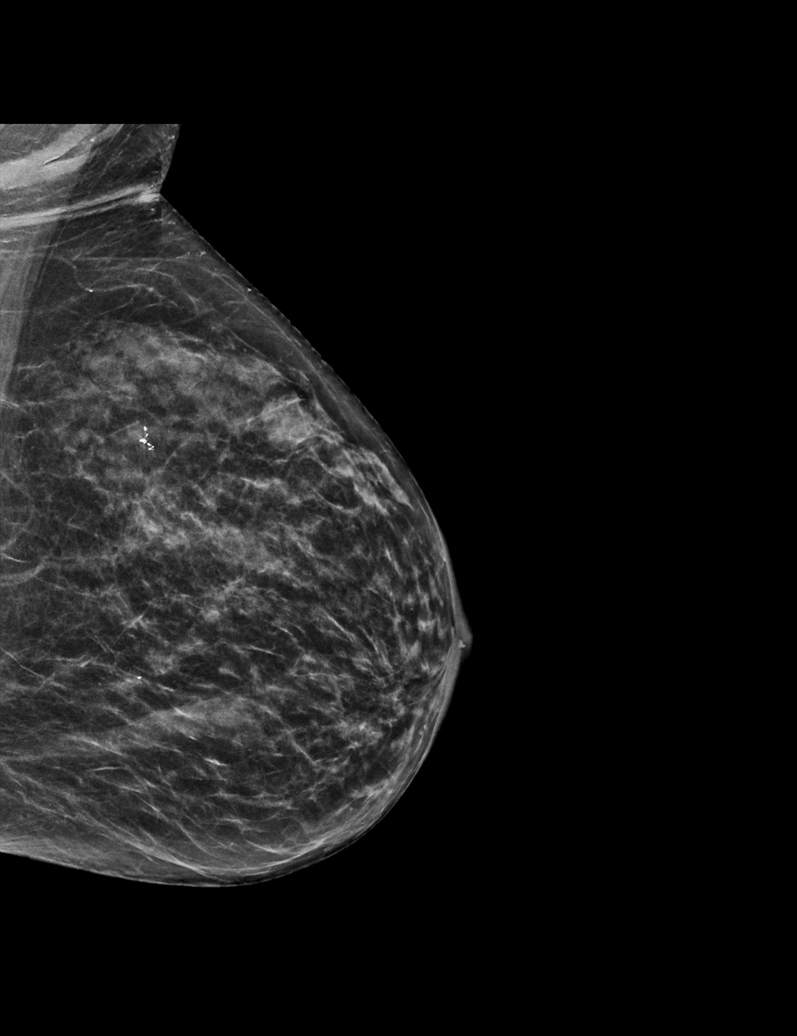

[R CC synth-2D]
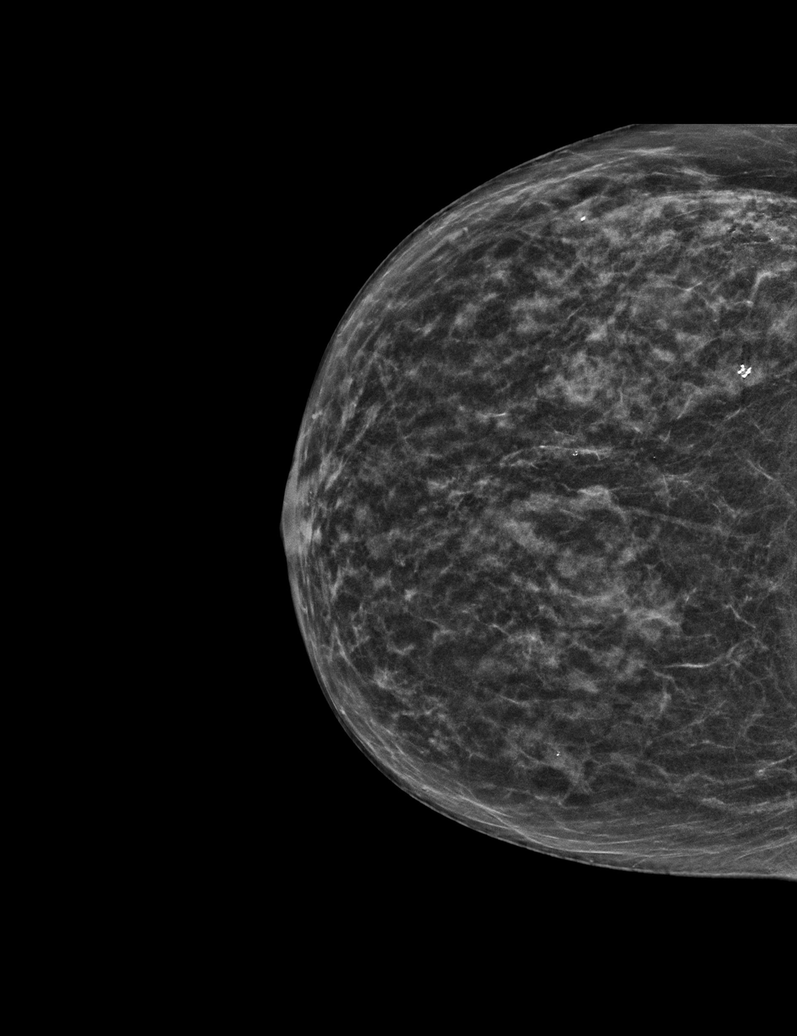

[L MLO synth-2D (2 of 2)]
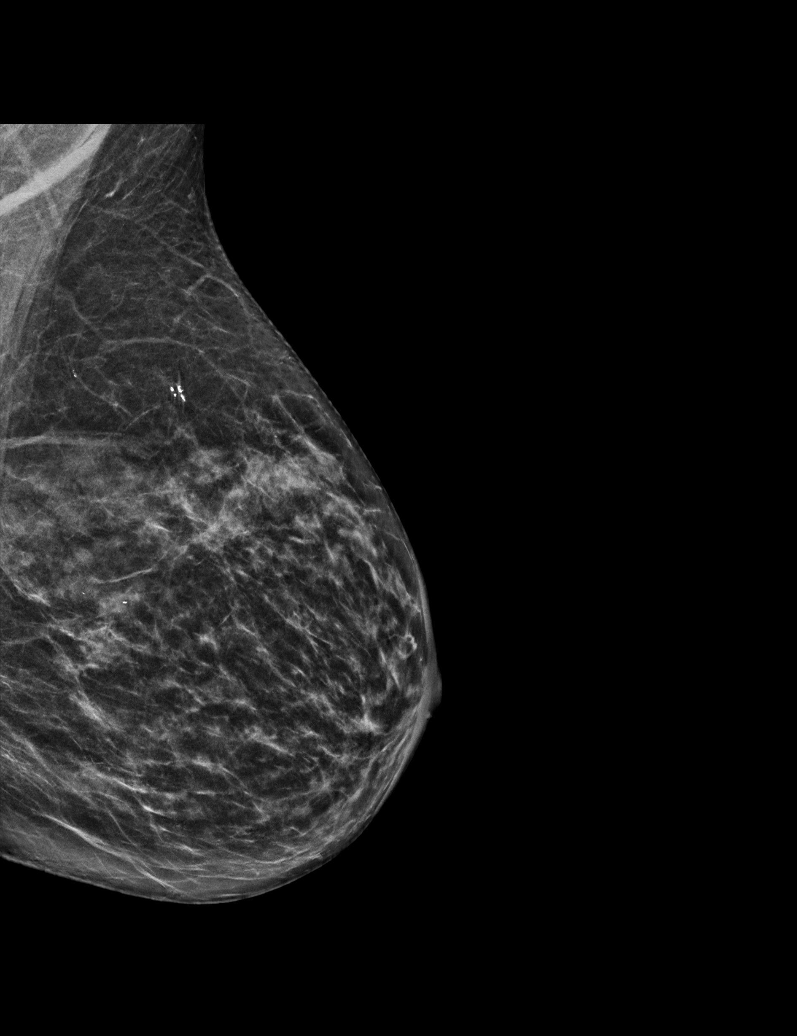

[R MLO tomo · tomo slice 27/54.0]
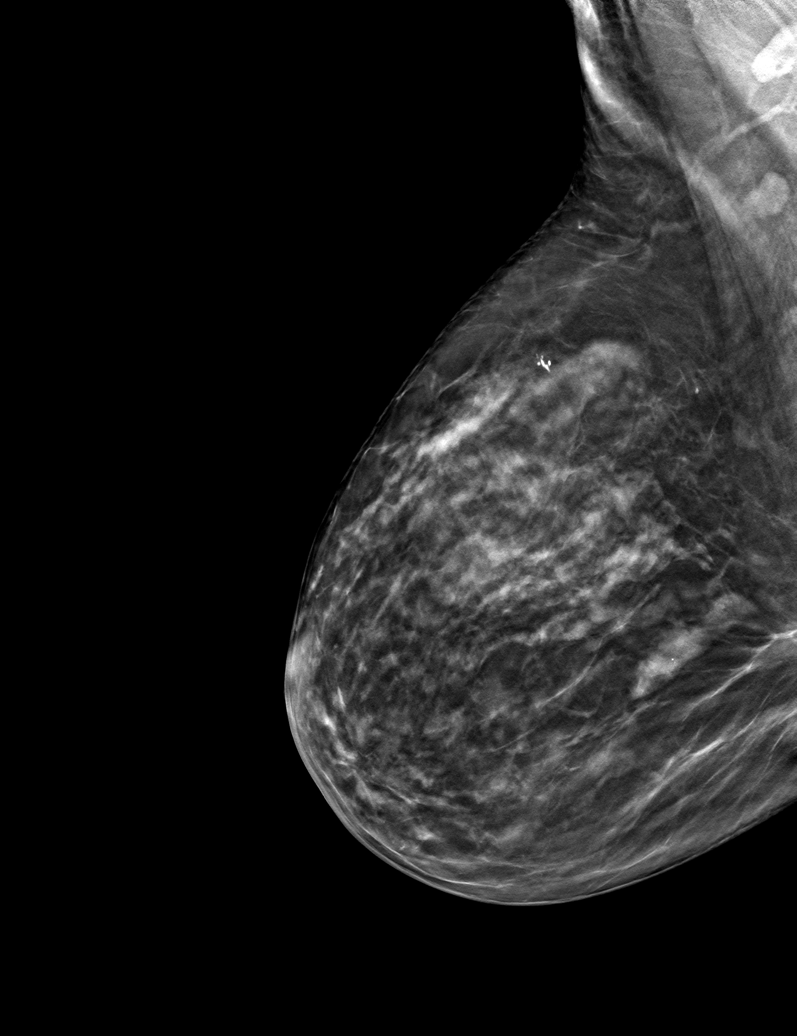

[6 of 30 positions shown; findings below may reference images not displayed]

ACR Breast Density Category b: There are scattered areas of
fibroglandular density.
FINDINGS: There are no findings suspicious for malignancy. Images were
processed with CAD.
IMPRESSION: No mammographic evidence of malignancy. A result letter of this
screening mammogram will be mailed directly to the patient.

RECOMMENDATION:
Screening mammogram in one year. (Code:[id number])

BI-RADS CATEGORY  1: Negative.

## 2021-05-03 DIAGNOSIS — B009 Herpesviral infection, unspecified: Secondary | ICD-10-CM | POA: Diagnosis not present

## 2021-05-03 DIAGNOSIS — F410 Panic disorder [episodic paroxysmal anxiety] without agoraphobia: Secondary | ICD-10-CM | POA: Diagnosis not present

## 2021-05-03 DIAGNOSIS — I10 Essential (primary) hypertension: Secondary | ICD-10-CM | POA: Diagnosis not present

## 2021-05-03 DIAGNOSIS — J309 Allergic rhinitis, unspecified: Secondary | ICD-10-CM | POA: Diagnosis not present

## 2021-05-03 DIAGNOSIS — R748 Abnormal levels of other serum enzymes: Secondary | ICD-10-CM | POA: Diagnosis not present

## 2021-05-03 DIAGNOSIS — R609 Edema, unspecified: Secondary | ICD-10-CM | POA: Diagnosis not present

## 2021-05-03 DIAGNOSIS — D7589 Other specified diseases of blood and blood-forming organs: Secondary | ICD-10-CM | POA: Diagnosis not present

## 2021-05-03 DIAGNOSIS — Z720 Tobacco use: Secondary | ICD-10-CM | POA: Diagnosis not present

## 2022-02-13 DIAGNOSIS — I10 Essential (primary) hypertension: Secondary | ICD-10-CM | POA: Diagnosis not present

## 2022-02-13 DIAGNOSIS — L538 Other specified erythematous conditions: Secondary | ICD-10-CM | POA: Diagnosis not present

## 2022-02-13 DIAGNOSIS — R208 Other disturbances of skin sensation: Secondary | ICD-10-CM | POA: Diagnosis not present

## 2022-02-13 DIAGNOSIS — L821 Other seborrheic keratosis: Secondary | ICD-10-CM | POA: Diagnosis not present

## 2022-02-13 DIAGNOSIS — Z720 Tobacco use: Secondary | ICD-10-CM | POA: Diagnosis not present

## 2022-02-13 DIAGNOSIS — E785 Hyperlipidemia, unspecified: Secondary | ICD-10-CM | POA: Diagnosis not present

## 2022-02-13 DIAGNOSIS — L820 Inflamed seborrheic keratosis: Secondary | ICD-10-CM | POA: Diagnosis not present

## 2022-02-13 DIAGNOSIS — R002 Palpitations: Secondary | ICD-10-CM | POA: Diagnosis not present

## 2022-02-13 DIAGNOSIS — Z789 Other specified health status: Secondary | ICD-10-CM | POA: Diagnosis not present

## 2022-06-26 DIAGNOSIS — Z23 Encounter for immunization: Secondary | ICD-10-CM | POA: Diagnosis not present

## 2022-06-26 DIAGNOSIS — I10 Essential (primary) hypertension: Secondary | ICD-10-CM | POA: Diagnosis not present

## 2022-06-26 DIAGNOSIS — E785 Hyperlipidemia, unspecified: Secondary | ICD-10-CM | POA: Diagnosis not present

## 2022-06-26 DIAGNOSIS — R718 Other abnormality of red blood cells: Secondary | ICD-10-CM | POA: Diagnosis not present

## 2022-06-26 DIAGNOSIS — R7989 Other specified abnormal findings of blood chemistry: Secondary | ICD-10-CM | POA: Diagnosis not present

## 2022-06-26 DIAGNOSIS — F17200 Nicotine dependence, unspecified, uncomplicated: Secondary | ICD-10-CM | POA: Diagnosis not present

## 2022-06-26 DIAGNOSIS — E559 Vitamin D deficiency, unspecified: Secondary | ICD-10-CM | POA: Diagnosis not present

## 2022-06-26 DIAGNOSIS — Z0000 Encounter for general adult medical examination without abnormal findings: Secondary | ICD-10-CM | POA: Diagnosis not present

## 2022-07-26 ENCOUNTER — Inpatient Hospital Stay: Payer: BC Managed Care – PPO

## 2022-07-26 ENCOUNTER — Inpatient Hospital Stay: Payer: BC Managed Care – PPO | Attending: Oncology | Admitting: Oncology

## 2022-07-26 NOTE — Progress Notes (Deleted)
[facility] Initial Visit:  Patient Care Team: Maurice Small, MD as PCP - General (Family Medicine) Maurice Small, MD (Family Medicine)  CHIEF COMPLAINTS/PURPOSE OF CONSULTATION:  Oncology History   No history exists.    HISTORY OF PRESENTING ILLNESS: Emma Warren 57 y.o. female is here because of  *** Medical history notable for tobacco use, hypertension hyperlipidemia December 15, 2019: Right upper quadrant abdominal ultrasound showed increased hepatic echogenicity consistent with steatosis December 27, 2019: CT abdomen showed morphologic change in liver consistent with early cirrhosis  June 26, 2022: WBC 4.9 hemoglobin 13.2 MCV 112 platelet count 259; 50 segs 42 lymphs 6 monos 1 EO folate 5.9 B12 622 CMP notable for AST of 97 ALT 49 Cholesterol 311 triglycerides 80 Review of Systems - Oncology  MEDICAL HISTORY: Past Medical History:  Diagnosis Date   Anxiety    Hypertension     SURGICAL HISTORY: Past Surgical History:  Procedure Laterality Date   left hand surgery     tendon repair    SOCIAL HISTORY: Social History   Socioeconomic History   Marital status: Widowed    Spouse name: Not on file   Number of children: 1   Years of education: phD   Highest education level: Not on file  Occupational History   Not on file  Tobacco Use   Smoking status: Every Day    Packs/day: 0.50    Years: 5.00    Total pack years: 2.50    Types: Cigarettes   Smokeless tobacco: Never   Tobacco comments:    8-15 cigs per day  Substance and Sexual Activity   Alcohol use: Not Currently    Alcohol/week: 1.0 standard drink of alcohol    Types: 1 Glasses of wine per week   Drug use: Never   Sexual activity: Not on file  Other Topics Concern   Not on file  Social History Narrative   Lives alone   Caffeine use: coffee daily (about 2 cups)   Right handed    Social Determinants of Health   Financial Resource Strain: Not on file  Food Insecurity: Not on file   Transportation Needs: Not on file  Physical Activity: Not on file  Stress: Not on file  Social Connections: Not on file  Intimate Partner Violence: Not on file    FAMILY HISTORY Family History  Problem Relation Age of Onset   Stomach cancer Maternal Grandmother    Stomach cancer Maternal Grandfather     ALLERGIES:  is allergic to losartan.  MEDICATIONS:  Current Outpatient Medications  Medication Sig Dispense Refill   ALPRAZolam (XANAX) 0.5 MG tablet Take 0.5 mg by mouth at bedtime as needed.     gabapentin (NEURONTIN) 300 MG capsule Take 1 capsule (300 mg total) by mouth 3 (three) times daily. 90 capsule 11   ibuprofen (ADVIL) 200 MG tablet Take 200 mg by mouth every 6 (six) hours as needed.     metoprolol succinate (TOPROL-XL) 50 MG 24 hr tablet Take 50 mg by mouth daily. Take with or immediately following a meal.     No current facility-administered medications for this visit.    PHYSICAL EXAMINATION:  ECOG PERFORMANCE STATUS: {CHL ONC ECOG PS:[phone]}   There were no vitals filed for this visit.  There were no vitals filed for this visit.   Physical Exam   LABORATORY DATA: I have personally reviewed the data as listed:  No visits with results within 1 Month(s) from this visit.  Latest  known visit with results is:  Office Visit on 04/24/2020  Component Date Value Ref Range Status   WBC 04/24/2020 7.8  3.4 - 10.8 x10E3/uL Final   RBC 04/24/2020 4.26  3.77 - 5.28 x10E6/uL Final   Hemoglobin 04/24/2020 13.6  11.1 - 15.9 g/dL Final   Hematocrit 04/24/2020 41.0  34.0 - 46.6 % Final   MCV 04/24/2020 96  79 - 97 fL Final   MCH 04/24/2020 31.9  26.6 - 33.0 pg Final   MCHC 04/24/2020 33.2  31.5 - 35.7 g/dL Final   RDW 04/24/2020 12.0  11.7 - 15.4 % Final   Platelets 04/24/2020 264  150 - 450 x10E3/uL Final   Neutrophils 04/24/2020 54  Not Estab. % Final   Lymphs 04/24/2020 34  Not Estab. % Final   Monocytes 04/24/2020 7  Not Estab. % Final   Eos 04/24/2020 4   Not Estab. % Final   Basos 04/24/2020 1  Not Estab. % Final   Neutrophils Absolute 04/24/2020 4.2  1.4 - 7.0 x10E3/uL Final   Lymphocytes Absolute 04/24/2020 2.6  0.7 - 3.1 x10E3/uL Final   Monocytes Absolute 04/24/2020 0.5  0.1 - 0.9 x10E3/uL Final   EOS (ABSOLUTE) 04/24/2020 0.3  0.0 - 0.4 x10E3/uL Final   Basophils Absolute 04/24/2020 0.1  0.0 - 0.2 x10E3/uL Final   Immature Granulocytes 04/24/2020 0  Not Estab. % Final   Immature Grans (Abs) 04/24/2020 0.0  0.0 - 0.1 x10E3/uL Final   TSH 04/24/2020 0.981  0.450 - 4.500 uIU/mL Final   Glucose 04/24/2020 105 (H)  65 - 99 mg/dL Final   BUN 04/24/2020 11  6 - 24 mg/dL Final   Creatinine, Ser 04/24/2020 0.81  0.57 - 1.00 mg/dL Final   GFR calc non Af Amer 04/24/2020 81  >59 mL/min/1.73 Final   GFR calc Af Amer 04/24/2020 94  >59 mL/min/1.73 Final   Comment: **In accordance with recommendations from the NKF-ASN Task force,**   [facility] is in the process of updating its eGFR calculation to the   2021 CKD-EPI creatinine equation that estimates kidney function   without a race variable.    BUN/Creatinine Ratio 04/24/2020 14  9 - 23 Final   Sodium 04/24/2020 143  134 - 144 mmol/L Final   Potassium 04/24/2020 4.3  3.5 - 5.2 mmol/L Final   Chloride 04/24/2020 105  96 - 106 mmol/L Final   CO2 04/24/2020 25  20 - 29 mmol/L Final   Calcium 04/24/2020 10.2  8.7 - 10.2 mg/dL Final   Total Protein 04/24/2020 7.6  6.0 - 8.5 g/dL Final   Albumin 04/24/2020 4.7  3.8 - 4.9 g/dL Final   Globulin, Total 04/24/2020 2.9  1.5 - 4.5 g/dL Final   Albumin/Globulin Ratio 04/24/2020 1.6  1.2 - 2.2 Final   Bilirubin Total 04/24/2020 <0.2  0.0 - 1.2 mg/dL Final   Alkaline Phosphatase 04/24/2020 160 (H)  44 - 121 IU/L Final                 **Please note reference interval change**   AST 04/24/2020 31  0 - 40 IU/L Final   ALT 04/24/2020 21  0 - 32 IU/L Final    RADIOGRAPHIC STUDIES: I have personally reviewed the radiological images as listed and agree with the  findings in the report  No results found.  ASSESSMENT/PLAN Cancer Staging  No matching staging information was found for the patient.   No problem-specific Assessment & Plan notes found for this encounter.  No orders of the defined types were placed in this encounter.   All questions were answered. The patient knows to call the clinic with any problems, questions or concerns.  This note was electronically signed.    Barbee Cough, MD  07/26/2022 1:18 PM

## 2022-07-30 ENCOUNTER — Telehealth: Payer: Self-pay | Admitting: Hematology and Oncology

## 2022-07-30 NOTE — Telephone Encounter (Signed)
R/s pt's new hem appt. Pt is aware of new date/time.

## 2022-08-29 ENCOUNTER — Inpatient Hospital Stay: Payer: BC Managed Care – PPO | Attending: Oncology | Admitting: Hematology and Oncology

## 2022-08-29 ENCOUNTER — Inpatient Hospital Stay: Payer: BC Managed Care – PPO

## 2022-08-29 ENCOUNTER — Other Ambulatory Visit: Payer: Self-pay

## 2022-08-29 VITALS — BP 147/93 | HR 76 | Temp 97.9°F | Resp 14 | Wt 114.5 lb

## 2022-08-29 DIAGNOSIS — E538 Deficiency of other specified B group vitamins: Secondary | ICD-10-CM | POA: Insufficient documentation

## 2022-08-29 DIAGNOSIS — D7589 Other specified diseases of blood and blood-forming organs: Secondary | ICD-10-CM | POA: Insufficient documentation

## 2022-08-29 DIAGNOSIS — I10 Essential (primary) hypertension: Secondary | ICD-10-CM | POA: Insufficient documentation

## 2022-08-29 DIAGNOSIS — F419 Anxiety disorder, unspecified: Secondary | ICD-10-CM | POA: Insufficient documentation

## 2022-08-29 DIAGNOSIS — F1090 Alcohol use, unspecified, uncomplicated: Secondary | ICD-10-CM | POA: Diagnosis not present

## 2022-08-29 LAB — CMP (CANCER CENTER ONLY)
ALT: 39 U/L (ref 0–44)
AST: 128 U/L — ABNORMAL HIGH (ref 15–41)
Albumin: 4.7 g/dL (ref 3.5–5.0)
Alkaline Phosphatase: 92 U/L (ref 38–126)
Anion gap: 13 (ref 5–15)
BUN: 5 mg/dL — ABNORMAL LOW (ref 6–20)
CO2: 23 mmol/L (ref 22–32)
Calcium: 9.4 mg/dL (ref 8.9–10.3)
Chloride: 97 mmol/L — ABNORMAL LOW (ref 98–111)
Creatinine: 0.55 mg/dL (ref 0.44–1.00)
GFR, Estimated: >60 mL/min (ref >60)
Glucose, Bld: 104 mg/dL — ABNORMAL HIGH (ref 70–99)
Potassium: 4 mmol/L (ref 3.5–5.1)
Sodium: 133 mmol/L — ABNORMAL LOW (ref 135–145)
Total Bilirubin: 0.5 mg/dL (ref 0.3–1.2)
Total Protein: 8 g/dL (ref 6.5–8.1)

## 2022-08-29 LAB — CBC WITH DIFFERENTIAL (CANCER CENTER ONLY)
Abs Immature Granulocytes: 0 10*3/uL (ref 0.00–0.07)
Basophils Absolute: 0 10*3/uL (ref 0.0–0.1)
Basophils Relative: 1 %
Eosinophils Absolute: 0.1 10*3/uL (ref 0.0–0.5)
Eosinophils Relative: 2 %
HCT: 35 % — ABNORMAL LOW (ref 36.0–46.0)
Hemoglobin: 12.8 g/dL (ref 12.0–15.0)
Immature Granulocytes: 0 %
Lymphocytes Relative: 45 %
Lymphs Abs: 2.7 10*3/uL (ref 0.7–4.0)
MCH: 38.9 pg — ABNORMAL HIGH (ref 26.0–34.0)
MCHC: 36.6 g/dL — ABNORMAL HIGH (ref 30.0–36.0)
MCV: 106.4 fL — ABNORMAL HIGH (ref 80.0–100.0)
Monocytes Absolute: 0.5 10*3/uL (ref 0.1–1.0)
Monocytes Relative: 9 %
Neutro Abs: 2.5 10*3/uL (ref 1.7–7.7)
Neutrophils Relative %: 43 %
Platelet Count: 155 10*3/uL (ref 150–400)
RBC: 3.29 MIL/uL — ABNORMAL LOW (ref 3.87–5.11)
RDW: 12 % (ref 11.5–15.5)
WBC Count: 5.9 10*3/uL (ref 4.0–10.5)
nRBC: 0 % (ref 0.0–0.2)

## 2022-08-29 LAB — RETIC PANEL
Immature Retic Fract: 10.6 % (ref 2.3–15.9)
RBC.: 3.34 MIL/uL — ABNORMAL LOW (ref 3.87–5.11)
Retic Count, Absolute: 60.1 10*3/uL (ref 19.0–186.0)
Retic Ct Pct: 1.8 % (ref 0.4–3.1)
Reticulocyte Hemoglobin: 41.2 pg (ref >27.9)

## 2022-08-29 LAB — FOLATE: Folate: 5.4 ng/mL — ABNORMAL LOW (ref >5.9)

## 2022-08-29 LAB — VITAMIN B12: Vitamin B-12: 374 pg/mL (ref 180–914)

## 2022-08-29 LAB — LACTATE DEHYDROGENASE: LDH: 219 U/L — ABNORMAL HIGH (ref 98–192)

## 2022-08-29 NOTE — Progress Notes (Signed)
[facility] [phone]   [phone] [place] NOTE  Patient Care Team: Maurice Small, MD as PCP - General (Family Medicine) Maurice Small, MD (Family Medicine)  Hematological/Oncological History # Macrocytosis without Anemia 06/26/2022: WBC 4.9, Hgb 13.2, MCV 111.5, Plt 259 08/29/2022: establish care with Dr. Lorenso Courier   CHIEF COMPLAINTS/PURPOSE OF CONSULTATION:  "Macrocytosis "  HISTORY OF PRESENTING ILLNESS:  Emma Warren 58 y.o. female with medical history significant for hypertension and anxiety who presents for evaluation of macrocytosis without anemia.  On review of the previous records the patient had labs drawn on 06/26/2022 which showed white blood cell count 4.9, hemoglobin 13.2, MCV 111.5, and platelets of 259.  Due to concern for these findings the patient was referred to hematology for further evaluation and management.   On exam today [name] reports that she tries to eat healthy and avoids fried food as well as sweets.  She reports she very seldom eats red meat and prefers [place] and chicken.  She notes that rarely she will eat a steak.  She notes that she does not enjoy eating green leafy vegetables will try to eat them on occasion.  She reports that she drinks approximately 3-5 alcoholic drinks on the weekend and tends to drink water down to white wine.  She reports that she does not drink much in the way of beer.  She notes that she has not been having any trouble with fatigue or fevers, chills, sweats, nausea vomiting or diarrhea.  On further discussion she reports that she is a retired PhD in IT sales professional.  She previously ptotic [place] and in [place].  She notes that she does smoke approximately 1 pack/day.  She has no family history markup for blood disease though her father developed cancer after being a [place] [employer] education officer.  Her mother passed away of COVID and she has a healthy 29 year old son.  Otherwise she has  been at her baseline level of health with no recent hospitalizations, emergency room visits, or other concerning findings.  A full 10 point ROS is otherwise negative.  MEDICAL HISTORY:  Past Medical History:  Diagnosis Date   Anxiety    Hypertension     SURGICAL HISTORY: Past Surgical History:  Procedure Laterality Date   left hand surgery     tendon repair    SOCIAL HISTORY: Social History   Socioeconomic History   Marital status: Widowed    Spouse name: Not on file   Number of children: 1   Years of education: phD   Highest education level: Not on file  Occupational History   Not on file  Tobacco Use   Smoking status: Every Day    Packs/day: 0.50    Years: 5.00    Additional pack years: 0.00    Total pack years: 2.50    Types: Cigarettes   Smokeless tobacco: Never   Tobacco comments:    8-15 cigs per day  Substance and Sexual Activity   Alcohol use: Not Currently    Alcohol/week: 1.0 standard drink of alcohol    Types: 1 Glasses of wine per week   Drug use: Never   Sexual activity: Not on file  Other Topics Concern   Not on file  Social History Narrative   Lives alone   Caffeine use: coffee daily (about 2 cups)   Right handed    Social Determinants of Health   Financial Resource Strain: Not on file  Food Insecurity: No Food Insecurity (08/29/2022)  Hunger Vital Sign    Worried About Running Out of Food in the Last Year: Never true    Ran Out of Food in the Last Year: Never true  Transportation Needs: No Transportation Needs (08/29/2022)   PRAPARE - Hydrologist (Medical): No    Lack of Transportation (Non-Medical): No  Physical Activity: Not on file  Stress: Not on file  Social Connections: Not on file  Intimate Partner Violence: Not At Risk (08/29/2022)   Humiliation, Afraid, Rape, and Kick questionnaire    Fear of Current or Ex-Partner: No    Emotionally Abused: No    Physically Abused: No    Sexually Abused: No     FAMILY HISTORY: Family History  Problem Relation Age of Onset   Stomach cancer Maternal Grandmother    Stomach cancer Maternal Grandfather     ALLERGIES:  is allergic to losartan.  MEDICATIONS:  Current Outpatient Medications  Medication Sig Dispense Refill   ibuprofen (ADVIL) 200 MG tablet Take 200 mg by mouth every 6 (six) hours as needed.     metoprolol succinate (TOPROL-XL) 50 MG 24 hr tablet Take 50 mg by mouth daily. Take with or immediately following a meal.     ALPRAZolam (XANAX) 0.5 MG tablet Take 0.5 mg by mouth at bedtime as needed. (Patient not taking: Reported on 08/29/2022)     gabapentin (NEURONTIN) 300 MG capsule Take 1 capsule (300 mg total) by mouth 3 (three) times daily. (Patient not taking: Reported on 08/29/2022) 90 capsule 11   No current facility-administered medications for this visit.    REVIEW OF SYSTEMS:   Constitutional: ( - ) fevers, ( - )  chills , ( - ) night sweats Eyes: ( - ) blurriness of vision, ( - ) double vision, ( - ) watery eyes Ears, nose, mouth, throat, and face: ( - ) mucositis, ( - ) sore throat Respiratory: ( - ) cough, ( - ) dyspnea, ( - ) wheezes Cardiovascular: ( - ) palpitation, ( - ) chest discomfort, ( - ) lower extremity swelling Gastrointestinal:  ( - ) nausea, ( - ) heartburn, ( - ) change in bowel habits Skin: ( - ) abnormal skin rashes Lymphatics: ( - ) new lymphadenopathy, ( - ) easy bruising Neurological: ( - ) numbness, ( - ) tingling, ( - ) new weaknesses Behavioral/Psych: ( - ) mood change, ( - ) new changes  All other systems were reviewed with the patient and are negative.  PHYSICAL EXAMINATION:  Vitals:   08/29/22 1405  BP: (!) 147/93  Pulse: 76  Resp: 14  Temp: 97.9 F (36.6 C)  SpO2: 96%   Filed Weights   08/29/22 1405  Weight: 114 lb 8 oz (51.9 kg)    GENERAL: well appearing middle-aged [place] female in NAD  SKIN: skin color, texture, turgor are normal, no rashes or significant lesions EYES:  conjunctiva are pink and non-injected, sclera clear LUNGS: clear to auscultation and percussion with normal breathing effort HEART: regular rate & rhythm and no murmurs and no lower extremity edema Musculoskeletal: no cyanosis of digits and no clubbing  PSYCH: alert & oriented x 3, fluent speech NEURO: no focal motor/sensory deficits  LABORATORY DATA:  I have reviewed the data as listed    Latest Ref Rng & Units 08/29/2022    2:43 PM 04/24/2020    3:57 PM 08/05/2011    3:16 AM  CBC  WBC 4.0 - 10.5 K/uL 5.9  7.8  Hemoglobin 12.0 - 15.0 g/dL 12.8  13.6  13.6   Hematocrit 36.0 - 46.0 % 35.0  41.0  40.0   Platelets 150 - 400 K/uL 155  264         Latest Ref Rng & Units 08/29/2022    2:43 PM 04/24/2020    3:57 PM 08/05/2011    3:16 AM  CMP  Glucose 70 - 99 mg/dL 104  105  122   BUN 6 - 20 mg/dL 5  11  5    Creatinine 0.44 - 1.00 mg/dL 0.55  0.81  0.70   Sodium 135 - 145 mmol/L 133  143  136   Potassium 3.5 - 5.1 mmol/L 4.0  4.3  3.7   Chloride 98 - 111 mmol/L 97  105  101   CO2 22 - 32 mmol/L 23  25    Calcium 8.9 - 10.3 mg/dL 9.4  10.2    Total Protein 6.5 - 8.1 g/dL 8.0  7.6    Total Bilirubin 0.3 - 1.2 mg/dL 0.5  <0.2    Alkaline Phos 38 - 126 U/L 92  160    AST 15 - 41 U/L 128  31    ALT 0 - 44 U/L 39  21       ASSESSMENT & PLAN Emma Warren 57 y.o. female with medical history significant for hypertension and anxiety who presents for evaluation of macrocytosis without anemia.  After review of the labs, review of the records, and discussion with the patient the patients findings are most consistent with macrocytosis of unclear etiology, potentially secondary to alcohol consumption.  # Macrocytosis without Anemia -- At this time findings are consistent with isolated macrocytosis with no other hematological abnormalities. -- Differential and etiologies include alcohol abuse, vitamin B12 deficiency, folate deficiency, or less likely an underlying bone marrow disorder. --  Will order nutritional studies with vitamin B12 and folate -- Will rule out hemolysis or reticulocytosis with reticulocyte panel, LDH -- Patient reports that she does sequently drink alcohol -- No clear indication for a bone marrow biopsy as her other blood counts are within normal limits. --RTC pending the results of the above studies   Orders Placed This Encounter  Procedures   CBC with Differential ([facility] Only)    Standing Status:   Future    Number of Occurrences:   1    Standing Expiration Date:   08/29/2023   CMP ([place] [facility] only)    Standing Status:   Future    Number of Occurrences:   1    Standing Expiration Date:   08/29/2023   Lactate dehydrogenase (LDH)    Standing Status:   Future    Number of Occurrences:   1    Standing Expiration Date:   08/29/2023   Retic Panel    Standing Status:   Future    Number of Occurrences:   1    Standing Expiration Date:   08/29/2023   Vitamin B12    Standing Status:   Future    Number of Occurrences:   1    Standing Expiration Date:   08/29/2023   Folate, Serum    Standing Status:   Future    Number of Occurrences:   1    Standing Expiration Date:   08/29/2023   Methylmalonic acid, serum    Standing Status:   Future    Number of Occurrences:   1    Standing Expiration Date:   08/29/2023  All questions were answered. The patient knows to call the clinic with any problems, questions or concerns.  A total of more than 60 minutes were spent on this encounter with face-to-face time and non-face-to-face time, including preparing to see the patient, ordering tests and/or medications, counseling the patient and coordination of care as outlined above.   Ledell Peoples, MD Department of Hematology/Oncology [place] at [facility] Phone: [phone] Pager: [phone] Email: [email or web address]  09/10/2022 1:43 PM

## 2022-09-01 LAB — METHYLMALONIC ACID, SERUM: Methylmalonic Acid, Quantitative: 120 nmol/L (ref 0–378)

## 2022-09-10 ENCOUNTER — Telehealth: Payer: Self-pay | Admitting: *Deleted

## 2022-09-10 MED ORDER — FOLIC ACID 1 MG PO TABS: 1.0000 mg | ORAL_TABLET | Freq: Every day | ORAL | 5 refills | Status: DC

## 2022-09-10 NOTE — Telephone Encounter (Signed)
Notified of message below. States she drinks white wine diluted with water. Encouraged her to abstain if possible. Folic Acid sent to [facility]

## 2022-09-10 NOTE — Telephone Encounter (Signed)
-----   Message from Orson Slick, MD sent at 09/10/2022  1:41 PM EDT ----- Please let Mrs. Phippen know that her blood work does show a mild folate deficiency, though I do suspect at this time the most likely cause of her macrocytosis (large RBC) is alcohol consumption.  Please call in 1 mg folic acid p.o. daily to her pharmacy of her choosing.  Additionally recommend abstaining from alcohol consumption to see if this improves the macrocytosis.  Will plan to see the patient back in approximately 6 months time to reevaluate. ----- Message ----- From: [place]: 08/29/2022   2:59 PM EDT To: Orson Slick, MD

## 2022-09-11 ENCOUNTER — Telehealth: Payer: Self-pay | Admitting: Hematology and Oncology

## 2022-09-11 NOTE — Telephone Encounter (Signed)
Per 3/27 Ib reached out to patient to schedule.

## 2023-02-06 ENCOUNTER — Encounter: Payer: Self-pay | Admitting: Internal Medicine

## 2023-02-06 ENCOUNTER — Ambulatory Visit (INDEPENDENT_AMBULATORY_CARE_PROVIDER_SITE_OTHER): Payer: BC Managed Care – PPO | Admitting: Internal Medicine

## 2023-02-06 VITALS — BP 120/100 | HR 84 | Temp 98.0°F | Ht 63.0 in | Wt 110.0 lb

## 2023-02-06 DIAGNOSIS — R21 Rash and other nonspecific skin eruption: Secondary | ICD-10-CM

## 2023-02-06 DIAGNOSIS — Z720 Tobacco use: Secondary | ICD-10-CM | POA: Diagnosis not present

## 2023-02-06 DIAGNOSIS — R7401 Elevation of levels of liver transaminase levels: Secondary | ICD-10-CM | POA: Insufficient documentation

## 2023-02-06 DIAGNOSIS — I10 Essential (primary) hypertension: Secondary | ICD-10-CM | POA: Insufficient documentation

## 2023-02-06 DIAGNOSIS — E538 Deficiency of other specified B group vitamins: Secondary | ICD-10-CM | POA: Diagnosis not present

## 2023-02-06 DIAGNOSIS — N390 Urinary tract infection, site not specified: Secondary | ICD-10-CM | POA: Diagnosis not present

## 2023-02-06 MED ORDER — TRIAMCINOLONE ACETONIDE 0.1 % EX CREA: 1.0000 | TOPICAL_CREAM | Freq: Two times a day (BID) | CUTANEOUS | 0 refills | Status: DC

## 2023-02-06 MED ORDER — METOPROLOL SUCCINATE ER 50 MG PO TB24: 50.0000 mg | ORAL_TABLET | Freq: Every day | ORAL | 3 refills | Status: DC

## 2023-02-06 MED ORDER — CLONIDINE HCL 0.1 MG PO TABS: 0.1000 mg | ORAL_TABLET | Freq: Every day | ORAL | 0 refills | Status: AC | PRN

## 2023-02-06 MED ORDER — NITROFURANTOIN MONOHYD MACRO 100 MG PO CAPS: 100.0000 mg | ORAL_CAPSULE | Freq: Two times a day (BID) | ORAL | 0 refills | Status: AC

## 2023-02-06 NOTE — Assessment & Plan Note (Signed)
She is taking folic acid and we reviewed all the results from her recent oncology visit. She does have macrocytosis without anemia.

## 2023-02-06 NOTE — Assessment & Plan Note (Signed)
Smoking 1/2 PPD for some time and not able to make quit attempt currently. She is aware of risk/harm from smoking.

## 2023-02-06 NOTE — Patient Instructions (Addendum)
We have sent in triamcinolone ointment to use daily on the face for 1-2 weeks.  I would recommend to do another ultrasound of the liver to check for any damage. Let us know if you want to do this. Likely the alcohol usage is causing the high liver numbers so if you can work on cutting back or stopping alcohol altogether.  We have sent in clonidine to take only as needed for high blood pressure. Try to take the metoprolol daily as this will help the blood pressure stay more stable.  We have sent in macrobid to take in case of UTI 1 pill twice a day for 5 days.

## 2023-02-06 NOTE — Assessment & Plan Note (Signed)
She does not take metoprolol 50 mg daily regularly. Asked her to try to take this daily. Rx clonidine as she rarely has high BP with symptoms and can use this as needed.

## 2023-02-06 NOTE — Progress Notes (Signed)
   Subjective:   Patient ID: Emma Warren, female    DOB: [birth year], 57 y.o.   MRN: [id number]  HPI The patient is a 57 YO female coming in new for ongoing care see A/P. Recent oncology assessment she has questions about. Current alcohol intake a few glasses of half wine/half water daily.   PMH, [facility], social history reviewed and updated  Review of Systems  Constitutional: Negative.   HENT: Negative.    Eyes: Negative.   Respiratory:  Negative for cough, chest tightness and shortness of breath.   Cardiovascular:  Negative for chest pain, palpitations and leg swelling.  Gastrointestinal:  Negative for abdominal distention, abdominal pain, constipation, diarrhea, nausea and vomiting.  Musculoskeletal: Negative.   Skin:  Positive for rash.  Neurological: Negative.   Psychiatric/Behavioral: Negative.      Objective:  Physical Exam Constitutional:      Appearance: She is well-developed.  HENT:     Head: Normocephalic and atraumatic.     Comments: Rash on forehead and chin Cardiovascular:     Rate and Rhythm: Normal rate and regular rhythm.  Pulmonary:     Effort: Pulmonary effort is normal. No respiratory distress.     Breath sounds: Normal breath sounds. No wheezing or rales.  Abdominal:     General: Bowel sounds are normal. There is no distension.     Palpations: Abdomen is soft.     Tenderness: There is no abdominal tenderness. There is no rebound.  Musculoskeletal:     Cervical back: Normal range of motion.  Skin:    General: Skin is warm and dry.  Neurological:     Mental Status: She is alert and oriented to person, place, and time.     Coordination: Coordination normal.     Vitals:   02/06/23 0923 02/06/23 0926  BP: (!) 120/100 (!) 120/100  Pulse: 84   Temp: 98 F (36.7 C)   TempSrc: Oral   SpO2: 93%   Weight: 110 lb (49.9 kg)   Height: 5\' 3"  (1.6 m)     Assessment & Plan:

## 2023-02-06 NOTE — Assessment & Plan Note (Signed)
Suspect allergic from collagen masks. Rx triamcinolone to use daily for 1-2 weeks to help.

## 2023-02-06 NOTE — Assessment & Plan Note (Signed)
Suspect from alcohol usage she does drink some glasses of white wine daily (she dilutes with water 50:50) and has had chronic AST elevated dating back at least 2021 (she had records to that date with her). She has prior US liver and CT scan with fatty liver and some signs of changes. She is advised I recommend repeat US RUQ to assess damage from alcohol given her LFTs are uptrending over the last few years. She declines today but will consider for future. Advised to cut back or stop altogether alcohol usage for her health.

## 2023-02-06 NOTE — Assessment & Plan Note (Signed)
Gets UTI with traveling often and rx macrobid 5 day course to have on hand with travel today.

## 2023-02-14 ENCOUNTER — Other Ambulatory Visit: Payer: Self-pay | Admitting: Internal Medicine

## 2023-03-14 ENCOUNTER — Other Ambulatory Visit: Payer: BC Managed Care – PPO

## 2023-03-14 ENCOUNTER — Ambulatory Visit: Payer: BC Managed Care – PPO | Admitting: Hematology and Oncology

## 2023-05-29 ENCOUNTER — Other Ambulatory Visit: Payer: Self-pay | Admitting: Hematology and Oncology

## 2023-06-30 ENCOUNTER — Ambulatory Visit: Payer: Self-pay | Admitting: Internal Medicine

## 2023-07-14 ENCOUNTER — Ambulatory Visit: Payer: BLUE CROSS/BLUE SHIELD | Admitting: Internal Medicine

## 2023-07-22 ENCOUNTER — Ambulatory Visit: Payer: BLUE CROSS/BLUE SHIELD | Admitting: Internal Medicine

## 2023-08-04 ENCOUNTER — Ambulatory Visit: Payer: BLUE CROSS/BLUE SHIELD | Admitting: Internal Medicine

## 2023-08-05 ENCOUNTER — Ambulatory Visit: Payer: BLUE CROSS/BLUE SHIELD | Admitting: Internal Medicine

## 2023-08-05 ENCOUNTER — Encounter: Payer: Self-pay | Admitting: Internal Medicine

## 2023-08-05 VITALS — BP 146/126 | HR 87 | Temp 97.7°F | Ht 63.0 in | Wt 111.0 lb

## 2023-08-05 DIAGNOSIS — I10 Essential (primary) hypertension: Secondary | ICD-10-CM | POA: Diagnosis not present

## 2023-08-05 DIAGNOSIS — R21 Rash and other nonspecific skin eruption: Secondary | ICD-10-CM

## 2023-08-05 DIAGNOSIS — R7401 Elevation of levels of liver transaminase levels: Secondary | ICD-10-CM | POA: Diagnosis not present

## 2023-08-05 LAB — COMPREHENSIVE METABOLIC PANEL
ALT: 47 U/L — ABNORMAL HIGH (ref 0–35)
AST: 135 U/L — ABNORMAL HIGH (ref 0–37)
Albumin: 4.8 g/dL (ref 3.5–5.2)
Alkaline Phosphatase: 94 U/L (ref 39–117)
BUN: 4 mg/dL — ABNORMAL LOW (ref 6–23)
CO2: 25 meq/L (ref 19–32)
Calcium: 9.6 mg/dL (ref 8.4–10.5)
Chloride: 92 meq/L — ABNORMAL LOW (ref 96–112)
Creatinine, Ser: 0.48 mg/dL (ref 0.40–1.20)
GFR: 103.42 mL/min (ref >60.00)
Glucose, Bld: 103 mg/dL — ABNORMAL HIGH (ref 70–99)
Potassium: 3.9 meq/L (ref 3.5–5.1)
Sodium: 130 meq/L — ABNORMAL LOW (ref 135–145)
Total Bilirubin: 0.6 mg/dL (ref 0.2–1.2)
Total Protein: 8.2 g/dL (ref 6.0–8.3)

## 2023-08-05 LAB — LIPID PANEL
Cholesterol: 298 mg/dL — ABNORMAL HIGH (ref 0–200)
HDL: 107.5 mg/dL (ref >39.00)
LDL Cholesterol: 175 mg/dL — ABNORMAL HIGH (ref 0–99)
NonHDL: 190.01
Total CHOL/HDL Ratio: 3
Triglycerides: 74 mg/dL (ref 0.0–149.0)
VLDL: 14.8 mg/dL (ref 0.0–40.0)

## 2023-08-05 LAB — CBC
HCT: 36.6 % (ref 36.0–46.0)
Hemoglobin: 12.8 g/dL (ref 12.0–15.0)
MCHC: 35 g/dL (ref 30.0–36.0)
MCV: 107.8 fL — ABNORMAL HIGH (ref 78.0–100.0)
Platelets: 166 10*3/uL (ref 150.0–400.0)
RBC: 3.39 Mil/uL — ABNORMAL LOW (ref 3.87–5.11)
RDW: 12.5 % (ref 11.5–15.5)
WBC: 5.4 10*3/uL (ref 4.0–10.5)

## 2023-08-05 LAB — HEMOGLOBIN A1C: Hgb A1c MFr Bld: 5.2 % (ref 4.6–6.5)

## 2023-08-05 LAB — VITAMIN B12: Vitamin B-12: 518 pg/mL (ref 211–911)

## 2023-08-05 MED ORDER — HYDROCHLOROTHIAZIDE 25 MG PO TABS: 25.0000 mg | ORAL_TABLET | Freq: Every day | ORAL | 3 refills | Status: DC

## 2023-08-05 MED ORDER — CLOBETASOL PROPIONATE 0.05 % EX OINT: 1.0000 | TOPICAL_OINTMENT | Freq: Two times a day (BID) | CUTANEOUS | 6 refills | Status: DC

## 2023-08-05 MED ORDER — CEPHALEXIN 500 MG PO CAPS
500.0000 mg | ORAL_CAPSULE | Freq: Two times a day (BID) | ORAL | 0 refills | Status: AC
Start: 2023-08-05 — End: 2023-08-12

## 2023-08-05 MED ORDER — OLOPATADINE HCL 0.2 % OP SOLN: OPHTHALMIC | 11 refills | Status: DC

## 2023-08-05 MED ORDER — METRONIDAZOLE 1 % EX GEL: Freq: Every day | CUTANEOUS | 0 refills | Status: AC

## 2023-08-05 NOTE — Patient Instructions (Addendum)
 We will have you stop metoprolol and switch to hydrochlorothiazide which you do not need to take with food. You can take this morning or night whichever is easier for blood pressure.  We have sent in metronidazole gel to use on forehead.  We have sent in clobetasol ointment to use twice a day on feet. We have also sent in keflex which is an antibiotic to take 1 pill twice a day for 1 week.  We have sent in the eye drops olopatadine.

## 2023-08-05 NOTE — Progress Notes (Unsigned)
   Subjective:   Patient ID: Emma Warren, female    DOB: 23-Apr-1966, 58 y.o.   MRN: [id number]  HPI The patient is here for concerns including rash on the foot and high BP. She has not been taking metoprolol regularly partially due to this saying she needs to take with food and she does not always eat breakfast.   PMH, FMH, social history reviewed and updated  Review of Systems  Constitutional: Negative.   HENT: Negative.    Eyes: Negative.   Respiratory:  Negative for cough, chest tightness and shortness of breath.   Cardiovascular:  Negative for chest pain, palpitations and leg swelling.  Gastrointestinal:  Negative for abdominal distention, abdominal pain, constipation, diarrhea, nausea and vomiting.  Musculoskeletal: Negative.   Skin:  Positive for color change and rash.  Neurological: Negative.   Psychiatric/Behavioral: Negative.      Objective:  Physical Exam Constitutional:      Appearance: She is well-developed.  HENT:     Head: Normocephalic and atraumatic.  Cardiovascular:     Rate and Rhythm: Normal rate and regular rhythm.  Pulmonary:     Effort: Pulmonary effort is normal. No respiratory distress.     Breath sounds: Normal breath sounds. No wheezing or rales.  Abdominal:     General: Bowel sounds are normal. There is no distension.     Palpations: Abdomen is soft.     Tenderness: There is no abdominal tenderness. There is no rebound.  Musculoskeletal:     Cervical back: Normal range of motion.  Skin:    General: Skin is warm and dry.     Findings: Rash present.     Comments: Rash with peeling and skin breakdown on the foot. Redness on the scalp and forehead stable from prior.   Neurological:     Mental Status: She is alert and oriented to person, place, and time.     Coordination: Coordination normal.     Vitals:   08/05/23 1012  BP: (!) 146/126  Pulse: 87  Temp: 97.7 F (36.5 C)  TempSrc: Oral  SpO2: 99%  Weight: 111 lb (50.3 kg)  Height: 5\' 3"   (1.6 m)    Assessment & Plan:

## 2023-08-06 DIAGNOSIS — R21 Rash and other nonspecific skin eruption: Secondary | ICD-10-CM | POA: Insufficient documentation

## 2023-08-06 NOTE — Assessment & Plan Note (Addendum)
 Unclear etiology of the rash on hands and feet. Checking CMP and CBC. Checking ANA panel and ANCA screening. Rx clobetasol ointment to use and trying to get BP under control. There is an area of redness near the 5th toe and this appears to be infected. Rx keflex 7 day course to treat.

## 2023-08-06 NOTE — Assessment & Plan Note (Signed)
 Severely elevated and not taking metoprolol regularly. Will d/c and start hydrochlorothiazide 25 mg daily. Follow up 2-4 weeks for BP check and labs. It is unclear if the high BP could be contributing to rash on foot.

## 2023-08-06 NOTE — Assessment & Plan Note (Signed)
 Could be related to rosacea. Rx metronidazole gel.

## 2023-08-06 NOTE — Assessment & Plan Note (Addendum)
 Needs follow up checking CMP. New rash on hands and feet (feet with itching) could be a sign of hepatic origin.

## 2023-08-07 LAB — ANA, IFA COMPREHENSIVE PANEL
Anti Nuclear Antibody (ANA): NEGATIVE
ENA SM Ab Ser-aCnc: <1 AI
SM/RNP: <1 AI
SSA (Ro) (ENA) Antibody, IgG: <1 AI
SSB (La) (ENA) Antibody, IgG: <1 AI
Scleroderma (Scl-70) (ENA) Antibody, IgG: <1 AI
ds DNA Ab: <1 [IU]/mL

## 2023-08-07 LAB — ANCA SCREEN W REFLEX TITER: ANCA SCREEN: NEGATIVE

## 2023-08-08 ENCOUNTER — Telehealth: Payer: Self-pay

## 2023-08-08 NOTE — Progress Notes (Signed)
 Called and LVM 1st attemp

## 2023-08-08 NOTE — Telephone Encounter (Signed)
 Copied from CRM [id number]. Topic: General - Call Back - No Documentation >> Aug 08, 2023  4:11 PM Florestine Avers wrote: Reason for CRM: Patient states that Dr. Lawana Chambers nurse asked her to give her a call back and did not disclose why.

## 2023-08-08 NOTE — Progress Notes (Signed)
 Called patient and spoke with her in regard to taking her new medication and there were no questions or concerns

## 2023-08-25 ENCOUNTER — Encounter: Payer: Self-pay | Admitting: Internal Medicine

## 2023-08-25 ENCOUNTER — Ambulatory Visit (INDEPENDENT_AMBULATORY_CARE_PROVIDER_SITE_OTHER): Payer: BC Managed Care – PPO | Admitting: Internal Medicine

## 2023-08-25 VITALS — BP 150/90 | HR 95 | Temp 98.6°F | Ht 63.0 in | Wt 112.0 lb

## 2023-08-25 DIAGNOSIS — R7401 Elevation of levels of liver transaminase levels: Secondary | ICD-10-CM

## 2023-08-25 DIAGNOSIS — I10 Essential (primary) hypertension: Secondary | ICD-10-CM | POA: Diagnosis not present

## 2023-08-25 DIAGNOSIS — R21 Rash and other nonspecific skin eruption: Secondary | ICD-10-CM | POA: Diagnosis not present

## 2023-08-25 LAB — COMPREHENSIVE METABOLIC PANEL
ALT: 40 U/L — ABNORMAL HIGH (ref 0–35)
AST: 64 U/L — ABNORMAL HIGH (ref 0–37)
Albumin: 4.7 g/dL (ref 3.5–5.2)
Alkaline Phosphatase: 100 U/L (ref 39–117)
BUN: 6 mg/dL (ref 6–23)
CO2: 27 meq/L (ref 19–32)
Calcium: 9.9 mg/dL (ref 8.4–10.5)
Chloride: 91 meq/L — ABNORMAL LOW (ref 96–112)
Creatinine, Ser: 0.5 mg/dL (ref 0.40–1.20)
GFR: 102.36 mL/min (ref >60.00)
Glucose, Bld: 90 mg/dL (ref 70–99)
Potassium: 3.5 meq/L (ref 3.5–5.1)
Sodium: 128 meq/L — ABNORMAL LOW (ref 135–145)
Total Bilirubin: 0.4 mg/dL (ref 0.2–1.2)
Total Protein: 7.9 g/dL (ref 6.0–8.3)

## 2023-08-25 LAB — PROTIME-INR
INR: 1.1 ratio — ABNORMAL HIGH (ref 0.8–1.0)
Prothrombin Time: 11.9 s (ref 9.6–13.1)

## 2023-08-25 LAB — CBC
HCT: 37 % (ref 36.0–46.0)
Hemoglobin: 12.7 g/dL (ref 12.0–15.0)
MCHC: 34.4 g/dL (ref 30.0–36.0)
MCV: 109.3 fl — ABNORMAL HIGH (ref 78.0–100.0)
Platelets: 262 10*3/uL (ref 150.0–400.0)
RBC: 3.38 Mil/uL — ABNORMAL LOW (ref 3.87–5.11)
RDW: 13.2 % (ref 11.5–15.5)
WBC: 5.8 10*3/uL (ref 4.0–10.5)

## 2023-08-25 MED ORDER — TRIAMCINOLONE ACETONIDE 0.1 % EX CREA: 1.0000 | TOPICAL_CREAM | Freq: Two times a day (BID) | CUTANEOUS | 0 refills | Status: AC

## 2023-08-25 NOTE — Patient Instructions (Addendum)
 We will send in triamcinolone ointment to use on the feet and red spots.   We will check the labs today.  Keep taking hydrochlorothiazide and metoprolol for blood pressure.

## 2023-08-25 NOTE — Progress Notes (Unsigned)
   Subjective:   Patient ID: Emma Warren, female    DOB: 02/20/1966, 58 y.o.   MRN: [id number]  HPI The patient is a 58 YO female coming in for follow up rash and BP. Rash on feet slightly improved but she is out of cream which was expensive. She is taking hydrochlorothiazide and metoprolol both for a little bit. Not checking BP but she can feel high BP when it happens and still having this sometimes.    Review of Systems  Constitutional: Negative.   HENT: Negative.    Eyes: Negative.   Respiratory:  Negative for cough, chest tightness and shortness of breath.   Cardiovascular:  Negative for chest pain, palpitations and leg swelling.  Gastrointestinal:  Negative for abdominal distention, abdominal pain, constipation, diarrhea, nausea and vomiting.  Musculoskeletal: Negative.   Skin:  Positive for rash.  Neurological: Negative.   Psychiatric/Behavioral: Negative.      Objective:  Physical Exam Constitutional:      Appearance: She is well-developed.  HENT:     Head: Normocephalic and atraumatic.  Cardiovascular:     Rate and Rhythm: Normal rate and regular rhythm.  Pulmonary:     Effort: Pulmonary effort is normal. No respiratory distress.     Breath sounds: Normal breath sounds. No wheezing or rales.  Abdominal:     General: Bowel sounds are normal. There is no distension.     Palpations: Abdomen is soft.     Tenderness: There is no abdominal tenderness. There is no rebound.  Musculoskeletal:     Cervical back: Normal range of motion.  Skin:    General: Skin is warm and dry.     Findings: Rash present.  Neurological:     Mental Status: She is alert and oriented to person, place, and time.     Coordination: Coordination normal.     Vitals:   08/25/23 1428 08/25/23 1430  BP: (!) 160/100 (!) 160/100  Pulse: 95   Temp: 98.6 F (37 C)   TempSrc: Oral   SpO2: 98%   Weight: 112 lb (50.8 kg)   Height: 5\' 3"  (1.6 m)     Assessment & Plan:

## 2023-08-28 NOTE — Assessment & Plan Note (Signed)
 She did not get metronidazole gel due to cost.

## 2023-08-28 NOTE — Assessment & Plan Note (Addendum)
 Checking CMP as this was rising at last check. Suspect related to daily alcohol use and asked her to reduce or stop. Checking CBC and PT/INR to assess.

## 2023-08-28 NOTE — Assessment & Plan Note (Signed)
 This is improving. I am unsure if her alcohol use or high BP could be driving this. Labs to rule out autoimmune cause were able to help rule those out.

## 2023-08-28 NOTE — Assessment & Plan Note (Signed)
 BP is elevated moderately today. She is taking hydrochlorothiazide 25 mg daily and metoprolol 50 mg daily. Will keep both and we talked about strategies of drinking less alcohol to help reduce the BP.

## 2023-11-03 ENCOUNTER — Telehealth: Payer: Self-pay

## 2023-11-03 NOTE — Telephone Encounter (Signed)
 Called and spoke with patient regarding results. Informed her that labs showed clear/stable and she expressed understanding. During the call patient informed me that she has since had a visit with her dermatologist [facility], Bettyann Bruin, MD) that had diagnosed her with psoriasis which is currently being treated with clobetasol  propionate. This does help the irritation but has not gotten rid of the rash. Patient will be following up with us  in June for their physical  Patient requesting labs to be printed and mailed to her which I am working on at the moment.

## 2023-11-03 NOTE — Telephone Encounter (Signed)
 I don't see any question, is there one?

## 2023-11-03 NOTE — Telephone Encounter (Signed)
 Copied from CRM [id number]. Topic: Clinical - Lab/Test Results >> Oct 31, 2023  5:08 PM Felizardo Hotter wrote: Reason for CRM: Pt would like go over in detail her labs from 08/25/2023. Please call pt at [phone].

## 2023-11-13 ENCOUNTER — Encounter: Admitting: Internal Medicine

## 2023-11-17 ENCOUNTER — Ambulatory Visit (INDEPENDENT_AMBULATORY_CARE_PROVIDER_SITE_OTHER): Admitting: Internal Medicine

## 2023-11-17 ENCOUNTER — Encounter: Payer: Self-pay | Admitting: Internal Medicine

## 2023-11-17 VITALS — BP 144/86 | HR 79 | Temp 98.3°F | Ht 63.0 in | Wt 107.5 lb

## 2023-11-17 DIAGNOSIS — R7401 Elevation of levels of liver transaminase levels: Secondary | ICD-10-CM | POA: Diagnosis not present

## 2023-11-17 DIAGNOSIS — Z1159 Encounter for screening for other viral diseases: Secondary | ICD-10-CM

## 2023-11-17 DIAGNOSIS — E538 Deficiency of other specified B group vitamins: Secondary | ICD-10-CM

## 2023-11-17 DIAGNOSIS — Z0001 Encounter for general adult medical examination with abnormal findings: Secondary | ICD-10-CM | POA: Diagnosis not present

## 2023-11-17 DIAGNOSIS — I10 Essential (primary) hypertension: Secondary | ICD-10-CM | POA: Diagnosis not present

## 2023-11-17 DIAGNOSIS — E871 Hypo-osmolality and hyponatremia: Secondary | ICD-10-CM | POA: Insufficient documentation

## 2023-11-17 DIAGNOSIS — Z1231 Encounter for screening mammogram for malignant neoplasm of breast: Secondary | ICD-10-CM

## 2023-11-17 DIAGNOSIS — Z720 Tobacco use: Secondary | ICD-10-CM

## 2023-11-17 LAB — VITAMIN B12: Vitamin B-12: 411 pg/mL (ref 211–911)

## 2023-11-17 LAB — COMPREHENSIVE METABOLIC PANEL WITH GFR
ALT: 41 U/L — ABNORMAL HIGH (ref 0–35)
AST: 79 U/L — ABNORMAL HIGH (ref 0–37)
Albumin: 4.4 g/dL (ref 3.5–5.2)
Alkaline Phosphatase: 70 U/L (ref 39–117)
BUN: 6 mg/dL (ref 6–23)
CO2: 25 meq/L (ref 19–32)
Calcium: 9.7 mg/dL (ref 8.4–10.5)
Chloride: 90 meq/L — ABNORMAL LOW (ref 96–112)
Creatinine, Ser: 0.55 mg/dL (ref 0.40–1.20)
GFR: 99.88 mL/min (ref >60.00)
Glucose, Bld: 85 mg/dL (ref 70–99)
Potassium: 3.3 meq/L — ABNORMAL LOW (ref 3.5–5.1)
Sodium: 126 meq/L — ABNORMAL LOW (ref 135–145)
Total Bilirubin: 0.4 mg/dL (ref 0.2–1.2)
Total Protein: 7.4 g/dL (ref 6.0–8.3)

## 2023-11-17 LAB — FOLATE: Folate: >23.2 ng/mL (ref >5.9)

## 2023-11-17 LAB — MAGNESIUM: Magnesium: 1.6 mg/dL (ref 1.5–2.5)

## 2023-11-17 LAB — CBC
HCT: 34.6 % — ABNORMAL LOW (ref 36.0–46.0)
Hemoglobin: 12 g/dL (ref 12.0–15.0)
MCHC: 34.7 g/dL (ref 30.0–36.0)
MCV: 107.6 fl — ABNORMAL HIGH (ref 78.0–100.0)
Platelets: 206 10*3/uL (ref 150.0–400.0)
RBC: 3.21 Mil/uL — ABNORMAL LOW (ref 3.87–5.11)
RDW: 13.3 % (ref 11.5–15.5)
WBC: 6 10*3/uL (ref 4.0–10.5)

## 2023-11-17 LAB — LIPID PANEL
Cholesterol: 297 mg/dL — ABNORMAL HIGH (ref 0–200)
HDL: 110.9 mg/dL (ref >39.00)
LDL Cholesterol: 167 mg/dL — ABNORMAL HIGH (ref 0–99)
NonHDL: 186.39
Total CHOL/HDL Ratio: 3
Triglycerides: 98 mg/dL (ref 0.0–149.0)
VLDL: 19.6 mg/dL (ref 0.0–40.0)

## 2023-11-17 LAB — TSH: TSH: 0.96 u[IU]/mL (ref 0.35–5.50)

## 2023-11-17 MED ORDER — OLOPATADINE HCL 0.2 % OP SOLN: OPHTHALMIC | 11 refills | Status: AC

## 2023-11-17 MED ORDER — METOPROLOL SUCCINATE ER 50 MG PO TB24: 50.0000 mg | ORAL_TABLET | Freq: Every day | ORAL | 3 refills | Status: AC

## 2023-11-17 MED ORDER — FOLIC ACID 1 MG PO TABS: 1.0000 mg | ORAL_TABLET | Freq: Every day | ORAL | 3 refills | Status: AC

## 2023-11-17 NOTE — Assessment & Plan Note (Signed)
 BP is elevated and may need to change hydrochlorothiazide  due to hyponatremia. She is taking metoprolol  50 mg daily and refilled. Will address need for change after labs checking CMP.

## 2023-11-17 NOTE — Assessment & Plan Note (Signed)
 Checking folic acid  and refilled.

## 2023-11-17 NOTE — Progress Notes (Signed)
   Subjective:   Patient ID: Emma Warren, female    DOB: 1965/07/08, 58 y.o.   MRN: [id number]  HPI The patient is here for physical. Still having troubles with rash and has questions about recent labs.   PMH, [facility], social history reviewed and updated  Review of Systems  Constitutional: Negative.   HENT: Negative.    Eyes: Negative.   Respiratory:  Negative for cough, chest tightness and shortness of breath.   Cardiovascular:  Negative for chest pain, palpitations and leg swelling.  Gastrointestinal:  Negative for abdominal distention, abdominal pain, constipation, diarrhea, nausea and vomiting.  Musculoskeletal: Negative.   Skin:  Positive for rash.  Neurological: Negative.   Psychiatric/Behavioral: Negative.      Objective:  Physical Exam Constitutional:      Appearance: She is well-developed.  HENT:     Head: Normocephalic and atraumatic.  Cardiovascular:     Rate and Rhythm: Normal rate and regular rhythm.  Pulmonary:     Effort: Pulmonary effort is normal. No respiratory distress.     Breath sounds: Normal breath sounds. No wheezing or rales.  Abdominal:     General: Bowel sounds are normal. There is no distension.     Palpations: Abdomen is soft.     Tenderness: There is no abdominal tenderness. There is no rebound.  Musculoskeletal:     Cervical back: Normal range of motion.  Skin:    General: Skin is warm and dry.     Findings: Rash present.  Neurological:     Mental Status: She is alert and oriented to person, place, and time.     Coordination: Coordination normal.     Vitals:   11/17/23 1354 11/17/23 1403  BP: (!) 140/84 (!) 144/86  Pulse: 79   Temp: 98.3 F (36.8 C)   TempSrc: Temporal   SpO2: 99%   Weight: 107 lb 8 oz (48.8 kg)   Height: 5\' 3"  (1.6 m)     Assessment & Plan:

## 2023-11-17 NOTE — Assessment & Plan Note (Signed)
 Checking CMP and hep c screening. Counseled she has fatty liver disease on [place]  2021 and alcohol use could impact this as well.

## 2023-11-17 NOTE — Assessment & Plan Note (Signed)
 Flu shot yearly. Pneumonia counseled. Shingrix counseled. Tetanus counseled. Colonoscopy likely due. Mammogram due ordered, pap smear up to date. Counseled about sun safety and mole surveillance. Counseled about the dangers of distracted driving. Given 10 year screening recommendations.

## 2023-11-17 NOTE — Assessment & Plan Note (Signed)
 Concern this could be related to hydrochlorothiazide . Checking CMP today and if not back to normal will need to switch with another agent. Checking TSH and magnesium and K as well to rule out alternate etiology.

## 2023-11-17 NOTE — Assessment & Plan Note (Signed)
 She was able to clarify her smoking history and does qualify for lung cancer screening which she is interested in. Referral placed. Counseled to quit. She is not able to make attempt.

## 2023-11-18 ENCOUNTER — Ambulatory Visit: Payer: Self-pay | Admitting: Internal Medicine

## 2023-11-18 LAB — HEPATITIS C ANTIBODY: Hepatitis C Ab: NONREACTIVE

## 2023-11-18 MED ORDER — AMLODIPINE BESYLATE 10 MG PO TABS: 10.0000 mg | ORAL_TABLET | Freq: Every day | ORAL | 1 refills | Status: DC

## 2023-11-20 ENCOUNTER — Ambulatory Visit

## 2023-11-20 NOTE — Progress Notes (Signed)
 I have mailed patient there results

## 2024-05-22 ENCOUNTER — Other Ambulatory Visit: Payer: Self-pay | Admitting: Internal Medicine

## 2024-06-29 ENCOUNTER — Telehealth: Payer: Self-pay | Admitting: Internal Medicine

## 2024-06-29 NOTE — Telephone Encounter (Unsigned)
 Copied from CRM [id number]. Topic: General - Billing Inquiry >> Jun 29, 2024  2:22 PM Tiffini S wrote: Reason for CRM: [place] called with new insurance with [employer]- asked for a call back from the office to confirm that the insurance is in network for the provider Please call the patient back at [phone]
